# Patient Record
Sex: Female | Born: 1985
Health system: Southern US, Community
[De-identification: ages and names within clinical notes are randomized; demographics above are authoritative.]

---

## 2018-03-25 ENCOUNTER — Encounter (HOSPITAL_COMMUNITY): Payer: Self-pay | Admitting: Emergency Medicine

## 2018-03-25 ENCOUNTER — Other Ambulatory Visit: Payer: Self-pay

## 2018-03-25 ENCOUNTER — Emergency Department (HOSPITAL_COMMUNITY): Payer: No Typology Code available for payment source

## 2018-03-25 ENCOUNTER — Emergency Department (HOSPITAL_COMMUNITY)
Admission: EM | Admit: 2018-03-25 | Discharge: 2018-03-25 | Disposition: A | Discharge status: Home / Self Care | Payer: No Typology Code available for payment source | Attending: Emergency Medicine | Admitting: Emergency Medicine

## 2018-03-25 DIAGNOSIS — R1030 Lower abdominal pain, unspecified: Secondary | ICD-10-CM | POA: Diagnosis present

## 2018-03-25 DIAGNOSIS — R102 Pelvic and perineal pain: Secondary | ICD-10-CM

## 2018-03-25 DIAGNOSIS — N760 Acute vaginitis: Secondary | ICD-10-CM

## 2018-03-25 LAB — CBC WITH DIFFERENTIAL/PLATELET
Abs Immature Granulocytes: 0.02 10*3/uL (ref 0.00–0.07)
Basophils Absolute: 0.1 10*3/uL (ref 0.0–0.1)
Basophils Relative: 1 %
Eosinophils Absolute: 0.1 10*3/uL (ref 0.0–0.5)
Eosinophils Relative: 2 %
HCT: 44.2 % (ref 36.0–46.0)
Hemoglobin: 13.8 g/dL (ref 12.0–15.0)
Immature Granulocytes: 0 %
Lymphocytes Relative: 27 %
Lymphs Abs: 1.6 10*3/uL (ref 0.7–4.0)
MCH: 26.1 pg (ref 26.0–34.0)
MCHC: 31.2 g/dL (ref 30.0–36.0)
MCV: 83.6 fL (ref 80.0–100.0)
Monocytes Absolute: 0.6 10*3/uL (ref 0.1–1.0)
Monocytes Relative: 11 %
Neutro Abs: 3.6 10*3/uL (ref 1.7–7.7)
Neutrophils Relative %: 59 %
Platelets: 216 10*3/uL (ref 150–400)
RBC: 5.29 MIL/uL — AB (ref 3.87–5.11)
RDW: 13.2 % (ref 11.5–15.5)
WBC: 6 10*3/uL (ref 4.0–10.5)
nRBC: 0 % (ref 0.0–0.2)

## 2018-03-25 LAB — COMPREHENSIVE METABOLIC PANEL
ALT: 15 U/L (ref 0–44)
AST: 14 U/L — ABNORMAL LOW (ref 15–41)
Albumin: 4.3 g/dL (ref 3.5–5.0)
Alkaline Phosphatase: 49 U/L (ref 38–126)
Anion gap: 6 (ref 5–15)
BUN: 11 mg/dL (ref 6–20)
CO2: 25 mmol/L (ref 22–32)
Calcium: 9.4 mg/dL (ref 8.9–10.3)
Chloride: 106 mmol/L (ref 98–111)
Creatinine, Ser: 0.85 mg/dL (ref 0.44–1.00)
GFR calc non Af Amer: >60 mL/min (ref >60)
Glucose, Bld: 104 mg/dL — ABNORMAL HIGH (ref 70–99)
Potassium: 3.9 mmol/L (ref 3.5–5.1)
SODIUM: 137 mmol/L (ref 135–145)
Total Bilirubin: 0.7 mg/dL (ref 0.3–1.2)
Total Protein: 7.6 g/dL (ref 6.5–8.1)

## 2018-03-25 LAB — LIPASE, BLOOD: Lipase: 23 U/L (ref 11–51)

## 2018-03-25 LAB — URINALYSIS, ROUTINE W REFLEX MICROSCOPIC
Bilirubin Urine: NEGATIVE
Glucose, UA: NEGATIVE mg/dL
Hgb urine dipstick: NEGATIVE
KETONES UR: NEGATIVE mg/dL
Leukocytes, UA: NEGATIVE
Nitrite: NEGATIVE
Protein, ur: NEGATIVE mg/dL
Specific Gravity, Urine: 1.028 (ref 1.005–1.030)
pH: 6 (ref 5.0–8.0)

## 2018-03-25 LAB — WET PREP, GENITAL
Sperm: NONE SEEN
Trich, Wet Prep: NONE SEEN
YEAST WET PREP: NONE SEEN

## 2018-03-25 LAB — POC URINE PREG, ED: PREG TEST UR: NEGATIVE

## 2018-03-25 MED ORDER — METRONIDAZOLE 500 MG PO TABS: 500.0000 mg | ORAL_TABLET | Freq: Two times a day (BID) | ORAL | 0 refills | Status: DC

## 2018-03-25 NOTE — ED Triage Notes (Signed)
Pt c/ fo intermittent lower abdominal pain since December.   Denies any n/v/d or urinary symptoms.  States having sharp lower abdominal pain this morning while standing.

## 2018-03-25 NOTE — Discharge Instructions (Signed)
Thank you for allowing me to care for you today. Please return to the emergency department if you have new or worsening symptoms. Take your medications as instructed.  ° °

## 2018-03-25 NOTE — ED Provider Notes (Signed)
Medical screening examination/treatment/procedure(s) were conducted as a shared visit with non-physician practitioner(s) and myself.  I personally evaluated the patient during the encounter.  Clinical Impression:   Final diagnoses:  Pelvic pain  Pelvic pain in female  Acute vaginitis    The patient is a pleasant 33 year old female presenting with some lower abdominal pain that started a couple months ago, it is waxed and waned in intensity, she had an appointment with her doctor which she missed due to a child with the flu however she reports that this morning the pain became much more intense, 10 out of 10 in severity, came on acutely but has since improved and is now 2 out of 10.  She has no associated fevers chills nausea vomiting or diarrhea.  She has no urinary symptoms.  On my exam she has a very soft abdomen but focal tenderness in the suprapubic and left lower quadrant.  There is no pain at McBurney's point, no Murphy sign, nonsurgical abdomen.  Pelvic exam will be performed by the physician assistant, ultrasound ordered to rule out torsion, may be ovarian cysts or urinary tract infection.  Rule out pregnancy and STDs.   Eber HongMiller, Solita Macadam, MD 03/25/18 1535

## 2018-03-25 NOTE — ED Provider Notes (Signed)
[facility] EMERGENCY DEPARTMENT Provider Note   CSN: [id number] Arrival date & time: 03/25/18  [id number]     History   Chief Complaint Chief Complaint  Patient presents with  . Abdominal Pain    HPI Wanda Young is a 33 y.o. female.  Patient is a 33 y/o G3P2 female who presents to the ED for complaints of lower abdominal pain, intermittent, worsening since December. She states she made an appointment with PCP but missed the appointment and then today the pain became worse so she came to the ED. Reports the pain is sharp and comes on suddenly, lasting about 10 minutes at a time and resolves on its own. Today she was getting ready for work when she felt a very sharp pain in her LLQ that was 10/10. It resolved to a 2/10 on its own now. Denies fever, chills, n/v/d, change in appetite. LMP was 03/03/17 and was normal. She is sexually active. Not on birth control. Denies dysuria, vaginal discharge.      History reviewed. No pertinent past medical history.  There are no active problems to display for this patient.   History reviewed. No pertinent surgical history.   OB History   No obstetric history on file.      Home Medications    Prior to Admission medications   Medication Sig Start Date End Date Taking? Authorizing Provider  metroNIDAZOLE (FLAGYL) 500 MG tablet Take 1 tablet (500 mg total) by mouth 2 (two) times daily. 03/25/18   Arlyn DunningMcLean, Dorthea Maina A, PA-C    Family History No family history on file.  Social History Social History   Tobacco Use  . Smoking status: Never Smoker  . Smokeless tobacco: Never Used  Substance Use Topics  . Alcohol use: Yes  . Drug use: Never     Allergies   Patient has no allergy information on record.   Review of Systems Review of Systems  Constitutional: Negative.   HENT: Negative for congestion, sinus pressure, sinus pain, sore throat and tinnitus.   Respiratory: Negative for cough and shortness of breath.   Cardiovascular: Negative  for chest pain.  Gastrointestinal: Positive for abdominal pain. Negative for anal bleeding, constipation, diarrhea, nausea and vomiting.  Genitourinary: Positive for pelvic pain. Negative for decreased urine volume, difficulty urinating, dyspareunia, dysuria, flank pain, frequency, genital sores, hematuria, menstrual problem, urgency, vaginal bleeding, vaginal discharge and vaginal pain.  Musculoskeletal: Negative for back pain and myalgias.  Skin: Negative for rash.  Allergic/Immunologic: Negative for immunocompromised state.  Neurological: Negative for dizziness, light-headedness and headaches.  Hematological: Negative for adenopathy.     Physical Exam Updated Vital Signs Pulse 83   Temp 98.3 F (36.8 C) (Oral)   Resp 17   Ht 5\' 2"  (1.575 m)   Wt 69.4 kg   LMP 03/03/2018   SpO2 100%   BMI 27.98 kg/m   Physical Exam Vitals signs reviewed.  Constitutional:      General: She is not in acute distress.    Appearance: She is well-developed. She is not ill-appearing, toxic-appearing or diaphoretic.     Comments: Patient observed walking by herself to exam room, no distress  HENT:     Head: Normocephalic and atraumatic.     Mouth/Throat:     Pharynx: No pharyngeal swelling or oropharyngeal exudate.  Eyes:     Extraocular Movements: Extraocular movements intact.  Cardiovascular:     Rate and Rhythm: Normal rate.     Heart sounds: Normal heart sounds.  Pulmonary:  Effort: Pulmonary effort is normal.     Breath sounds: Normal breath sounds.  Abdominal:     General: Abdomen is flat.     Palpations: Abdomen is soft.     Tenderness: There is abdominal tenderness in the right lower quadrant, suprapubic area and left lower quadrant. There is no right CVA tenderness or left CVA tenderness.  Genitourinary:    Vagina: Normal.     Cervix: Normal.     Uterus: Tender.      Adnexa: Right adnexa normal.     Rectum: Normal.  Skin:    General: Skin is warm.  Neurological:      General: No focal deficit present.     Mental Status: She is alert.  Psychiatric:        Mood and Affect: Mood normal.      ED Treatments / Results  Labs (all labs ordered are listed, but only abnormal results are displayed) Labs Reviewed  WET PREP, GENITAL - Abnormal; Notable for the following components:      Result Value   Clue Cells Wet Prep HPF POC PRESENT (*)    WBC, Wet Prep HPF POC FEW (*)    All other components within normal limits  CBC WITH DIFFERENTIAL/PLATELET - Abnormal; Notable for the following components:   RBC 5.29 (*)    All other components within normal limits  COMPREHENSIVE METABOLIC PANEL - Abnormal; Notable for the following components:   Glucose, Bld 104 (*)    AST 14 (*)    All other components within normal limits  URINALYSIS, ROUTINE W REFLEX MICROSCOPIC  LIPASE, BLOOD  POC URINE PREG, ED  GC/CHLAMYDIA PROBE AMP ([place]) NOT AT [facility]    EKG None  Radiology [place] Pelvic Complete W Transvaginal And Torsion R/o  Result Date: 03/25/2018 CLINICAL DATA:  Pelvic pain for 3 months. EXAM: TRANSABDOMINAL AND TRANSVAGINAL ULTRASOUND OF PELVIS DOPPLER ULTRASOUND OF OVARIES TECHNIQUE: Both transabdominal and transvaginal ultrasound examinations of the pelvis were performed. Transabdominal technique was performed for global imaging of the pelvis including uterus, ovaries, adnexal regions, and pelvic cul-de-sac. It was necessary to proceed with endovaginal exam following the transabdominal exam to visualize the adnexa. Color and duplex Doppler ultrasound was utilized to evaluate blood flow to the ovaries. COMPARISON:  None. FINDINGS: Uterus Measurements: 9.8 x 6.2 x 7.0 cm = volume: 223.2 mL. Two fibroids are identified. One anteriorly on the right measures 1.9 cm and a second posteriorly on the left measures 2.4 cm. Endometrium Thickness: 0.7 cm.  No focal abnormality visualized. Right ovary Measurements: 2.7 x 1.9 x 2.0 cm = volume: 5.4 mL. Normal appearance/no  adnexal mass. Left ovary Measurements: 4.1 x 4.3 x 1.9 cm = volume: 18.5 mL. Normal appearance/no adnexal mass. Pulsed Doppler evaluation of both ovaries demonstrates normal low-resistance arterial and venous waveforms. Other findings No abnormal free fluid. IMPRESSION: Negative for ovarian torsion.  Negative exam. Electronically Signed   By: Drusilla Kannerhomas  Dalessio M.D.   On: 03/25/2018 11:24    Procedures Procedures (including critical care time)  Medications Ordered in ED Medications - No data to display   Initial Impression / Assessment and Plan / ED Course  I have reviewed the triage vital signs and the nursing notes.  Pertinent labs & imaging results that were available during my care of the patient were reviewed by me and considered in my medical decision making (see chart for details).  Clinical Course as of Mar 26 1143  Sat Mar 25, 2018  1142  Workup essentially negative. Patient does have some clue cells on wet prep and would like to be treated for vaginitis. I will treat with flagyl. Advised to f/u with PMD and return precautions discussed.    [KM]    Clinical Course User Index [KM] Arlyn Dunning, PA-C    Based on review of vitals, medical screening exam, lab work and/or imaging, there does not appear to be an acute, emergent etiology for the patient's symptoms. Counseled pt on good return precautions and encouraged both PCP and ED follow-up as needed.  Prior to discharge, I also discussed incidental imaging findings with patient in detail and advised appropriate, recommended follow-up in detail.  Clinical Impression: 1. Pelvic pain in female   2. Pelvic pain   3. Acute vaginitis     Disposition: Discharge    Final Clinical Impressions(s) / ED Diagnoses   Final diagnoses:  Pelvic pain  Pelvic pain in female  Acute vaginitis    ED Discharge Orders         Ordered    metroNIDAZOLE (FLAGYL) 500 MG tablet  2 times daily     03/25/18 1145           Jeral Pinch 03/25/18 1145    Eber Hong, MD 03/25/18 1535

## 2018-03-27 LAB — GC/CHLAMYDIA PROBE AMP (~~LOC~~) NOT AT ARMC
Chlamydia: NEGATIVE
Neisseria Gonorrhea: NEGATIVE

## 2019-03-25 ENCOUNTER — Encounter: Payer: Self-pay | Admitting: Emergency Medicine

## 2019-03-25 ENCOUNTER — Ambulatory Visit (INDEPENDENT_AMBULATORY_CARE_PROVIDER_SITE_OTHER): Payer: 59

## 2019-03-25 ENCOUNTER — Ambulatory Visit
Admission: EM | Admit: 2019-03-25 | Discharge: 2019-03-25 | Disposition: A | Discharge status: Home / Self Care | Payer: 59

## 2019-03-25 ENCOUNTER — Other Ambulatory Visit: Payer: Self-pay

## 2019-03-25 DIAGNOSIS — R0789 Other chest pain: Secondary | ICD-10-CM

## 2019-03-25 DIAGNOSIS — U071 COVID-19: Secondary | ICD-10-CM

## 2019-03-25 MED ORDER — BENZONATATE 100 MG PO CAPS: 100.0000 mg | ORAL_CAPSULE | Freq: Three times a day (TID) | ORAL | 0 refills | Status: DC

## 2019-03-25 MED ORDER — FLUTICASONE PROPIONATE 50 MCG/ACT NA SUSP: 1.0000 | Freq: Every day | NASAL | 0 refills | Status: DC

## 2019-03-25 NOTE — Discharge Instructions (Addendum)
You should remain isolated in your home for 10 days from symptom onset AND greater than 72 hours after symptoms resolution (absence of fever without the use of fever-reducing medication and improvement in respiratory symptoms), whichever is longer °Get plenty of rest and push fluids °Use medications daily for symptom relief °Use OTC medications like ibuprofen or tylenol as needed fever or pain °Call or go to the ED if you have any new or worsening symptoms such as fever, worsening cough, shortness of breath, chest tightness, chest pain, turning blue, changes in mental status, etc... °

## 2019-03-25 NOTE — ED Triage Notes (Signed)
Pt presents to UC w/ c/o increased pressure in chest since yesterday. Pt tested positive 3 days ago. Started having symptoms 1 week ago. Pt states she has a cough but is not productive. Pt is concerned for pneumonia.

## 2019-03-25 NOTE — ED Provider Notes (Signed)
[facility]    CSN: [id number] Arrival date & time: 03/25/19  1538      History   Chief Complaint Chief Complaint  Patient presents with  . covid +, chest tightness    HPI Wanda Young is a 34 y.o. female.   Who presented to the urgent care with a complaint of worsening Covid symptoms.  Reported cough, and chest tightness.  Report she tested positive for COVID-19 3 days ago.  Denies sick exposure to flu or strep.  Denies recent travel.  Denies aggravating or alleviating symptoms.  Denies previous COVID infection.   Denies fever, chills, fatigue, nasal congestion, rhinorrhea, sore throat, SOB, wheezing, chest pain, nausea, vomiting, changes in bowel or bladder habits.  She want a chest x-ray to be completed.  The history is provided by the patient. No language interpreter was used.    History reviewed. No pertinent past medical history.  There are no problems to display for this patient.   History reviewed. No pertinent surgical history.  OB History   No obstetric history on file.      Home Medications    Prior to Admission medications   Medication Sig Start Date End Date Taking? Authorizing Provider  cholecalciferol (VITAMIN D3) 25 MCG (1000 UNIT) tablet Take 1,000 Units by mouth daily.   Yes [provider]  Multiple Vitamin (MULTIVITAMIN) capsule Take 1 capsule by mouth daily.   Yes [provider]  benzonatate (TESSALON) 100 MG capsule Take 1 capsule (100 mg total) by mouth every 8 (eight) hours. 03/25/19   Kaysha Parsell, Darrelyn Hillock, FNP  fluticasone (FLONASE) 50 MCG/ACT nasal spray Place 1 spray into both nostrils daily for 14 days. 03/25/19 04/08/19  Dequavion Follette, Darrelyn Hillock, FNP  metroNIDAZOLE (FLAGYL) 500 MG tablet Take 1 tablet (500 mg total) by mouth 2 (two) times daily. 03/25/18   Alveria Apley, PA-C    Family History Family History  Problem Relation Age of Onset  . Healthy Mother   . Healthy Father     Social History Social History     Tobacco Use  . Smoking status: Never Smoker  . Smokeless tobacco: Never Used  Substance Use Topics  . Alcohol use: Yes  . Drug use: Never     Allergies   Patient has no known allergies.   Review of Systems Review of Systems  Constitutional: Negative.   HENT: Negative.   Respiratory: Positive for cough and chest tightness.   Cardiovascular: Negative.   Gastrointestinal: Negative.   Neurological: Negative.      Physical Exam Triage Vital Signs ED Triage Vitals  Enc Vitals Group     BP      Pulse      Resp      Temp      Temp src      SpO2      Weight      Height      Head Circumference      Peak Flow      Pain Score      Pain Loc      Pain Edu?      Excl. in [place]?    No data found.  Updated Vital Signs BP 129/90 (BP Location: Right Arm)   Pulse 82   Temp 98.2 F (36.8 C) (Oral)   Resp 16   SpO2 98%   Visual Acuity Right Eye Distance:   Left Eye Distance:   Bilateral Distance:    Right Eye Near:  Left Eye Near:    Bilateral Near:     Physical Exam Vitals and nursing note reviewed.  Constitutional:      General: She is not in acute distress.    Appearance: Normal appearance. She is normal weight. She is not ill-appearing or toxic-appearing.  HENT:     Head: Normocephalic.     Right Ear: Tympanic membrane, ear canal and external ear normal. There is no impacted cerumen.     Left Ear: Tympanic membrane, ear canal and external ear normal. There is no impacted cerumen.     Nose: Nose normal. No congestion.     Mouth/Throat:     Mouth: Mucous membranes are moist.     Pharynx: Oropharynx is clear. No oropharyngeal exudate or posterior oropharyngeal erythema.  Cardiovascular:     Rate and Rhythm: Normal rate and regular rhythm.     Pulses: Normal pulses.     Heart sounds: Normal heart sounds. No murmur.  Pulmonary:     Effort: Pulmonary effort is normal. No respiratory distress.     Breath sounds: Normal breath sounds. No wheezing or rhonchi.   Chest:     Chest wall: No tenderness.  Abdominal:     General: Abdomen is flat. Bowel sounds are normal. There is no distension.     Palpations: There is no mass.     Tenderness: There is no abdominal tenderness.  Skin:    Capillary Refill: Capillary refill takes less than 2 seconds.  Neurological:     General: No focal deficit present.     Mental Status: She is alert and oriented to person, place, and time.      UC Treatments / Results  Labs (all labs ordered are listed, but only abnormal results are displayed) Labs Reviewed - No data to display  EKG   Radiology No results found.  Procedures Procedures (including critical care time)  Medications Ordered in UC Medications - No data to display  Initial Impression / Assessment and Plan / UC Course  I have reviewed the triage vital signs and the nursing notes.  Pertinent labs & imaging results that were available during my care of the patient were reviewed by me and considered in my medical decision making (see chart for details).    Chest x-ray was ordered and result was reviewed.  Result was negative for  Cardiopulmonary disease Tessalon was ordered Flonase was ordered Advised patient to remain quarantine To go to ED for worsening of symptoms Patient verbalized understanding of the plan of care  Final Clinical Impressions(s) / UC Diagnoses   Final diagnoses:  COVID-19 virus infection     Discharge Instructions     You should remain isolated in your home for 10 days from symptom onset AND greater than 72 hours after symptoms resolution (absence of fever without the use of fever-reducing medication and improvement in respiratory symptoms), whichever is longer Get plenty of rest and push fluids Use medications daily for symptom relief Use OTC medications like ibuprofen or tylenol as needed fever or pain Call or go to the ED if you have any new or worsening symptoms such as fever, worsening cough, shortness of  breath, chest tightness, chest pain, turning blue, changes in mental status, etc...     ED Prescriptions    Medication Sig Dispense Auth. Provider   benzonatate (TESSALON) 100 MG capsule Take 1 capsule (100 mg total) by mouth every 8 (eight) hours. 21 capsule Timiko Offutt, Zachery Dakins, FNP   fluticasone (FLONASE) 50  MCG/ACT nasal spray Place 1 spray into both nostrils daily for 14 days. 16 g Durward Parcel, FNP     PDMP not reviewed this encounter.   Durward Parcel, FNP 03/25/19 1609

## 2019-05-02 ENCOUNTER — Ambulatory Visit (INDEPENDENT_AMBULATORY_CARE_PROVIDER_SITE_OTHER): Payer: 59 | Admitting: Advanced Practice Midwife

## 2019-05-02 ENCOUNTER — Encounter: Payer: Self-pay | Admitting: Advanced Practice Midwife

## 2019-05-02 ENCOUNTER — Other Ambulatory Visit (HOSPITAL_COMMUNITY)
Admission: RE | Admit: 2019-05-02 | Discharge: 2019-05-02 | Disposition: A | Discharge status: Home / Self Care | Payer: 59 | Source: Ambulatory Visit | Attending: Advanced Practice Midwife | Admitting: Advanced Practice Midwife

## 2019-05-02 ENCOUNTER — Other Ambulatory Visit: Payer: Self-pay

## 2019-05-02 VITALS — BP 130/89 | HR 72 | Ht 62.0 in | Wt 153.5 lb

## 2019-05-02 DIAGNOSIS — Z3202 Encounter for pregnancy test, result negative: Secondary | ICD-10-CM | POA: Diagnosis not present

## 2019-05-02 DIAGNOSIS — Z01419 Encounter for gynecological examination (general) (routine) without abnormal findings: Secondary | ICD-10-CM

## 2019-05-02 LAB — POCT URINE PREGNANCY: Preg Test, Ur: NEGATIVE

## 2019-05-02 MED ORDER — ETONOGESTREL-ETHINYL ESTRADIOL 0.12-0.015 MG/24HR VA RING: VAGINAL_RING | VAGINAL | 3 refills | Status: DC

## 2019-05-02 NOTE — Progress Notes (Signed)
WELL-WOMAN EXAMINATION Patient name: Wanda Young MRN [id number]  Date of birth: [birth year] Chief Complaint:   Gynecologic Exam (interested in birth control pills)  History of Present Illness:   Wanda Young is a 34 y.o. G82P2012 African American female being seen today for a routine well-woman exam.  Current complaints: Not currently on contraception- would like to start> options rev'd and decision for Nuvaring; had hx abn pap/colpo years ago followed by neg Paps since.  PCP: needs to establish- recommended Robbie Lis      Did not discuss labs Patient's last menstrual period was 04/22/2019. The current method of family planning is none.  Last pap not sure. Results were: normal. H/O abnormal pap: Yes Last mammogram: none yet. Family h/o breast cancer: Yes- PGM in her 74s Last colonoscopy: none yet. Family h/o colorectal cancer: No Review of Systems:   Pertinent items are noted in HPI Denies any headaches, blurred vision, fatigue, shortness of breath, chest pain, abdominal pain, abnormal vaginal discharge/itching/odor/irritation, problems with periods, bowel movements, urination, or intercourse unless otherwise stated above. Pertinent History Reviewed:  Reviewed past medical,surgical, social and family history.  Reviewed problem list, medications and allergies. Physical Assessment:   Vitals:   05/02/19 1445  BP: 130/89  Pulse: 72  Weight: 153 lb 8 oz (69.6 kg)  Height: 5\' 2"  (1.575 m)  Body mass index is 28.08 kg/m.        Physical Examination:   General appearance - well appearing, and in no distress  Mental status - alert, oriented to person, place, and time  Psych:  She has a normal mood and affect  Skin - warm and dry, normal color, no suspicious lesions noted  Chest - effort normal, all lung fields clear to auscultation bilaterally  Heart - normal rate and regular rhythm  Neck:  midline trachea, no thyromegaly or nodules  Breasts - breasts appear normal, no suspicious  masses, no skin or nipple changes or  axillary nodes  Abdomen - soft, nontender, nondistended, no masses or organomegaly  Pelvic - VULVA: normal appearing vulva with no masses, tenderness or lesions  VAGINA: normal appearing vagina with normal color and discharge, no lesions  CERVIX: normal appearing cervix without discharge or lesions, no CMT  Thin prep pap is done with HR HPV cotesting  UTERUS: uterus is felt to be normal size, shape, consistency and nontender   ADNEXA: No adnexal masses or tenderness noted.  Extremities:  No swelling or varicosities noted  Chaperone: Amanda Rash    Results for orders placed or performed in visit on 05/02/19 (from the past 24 hour(s))  POCT urine pregnancy   Collection Time: 05/02/19  2:54 PM  Result Value Ref Range   Preg Test, Ur Negative Negative    Assessment & Plan:  1) Well-Woman Exam  2) New start on Nuvaring> begin with next menses, use backup for the first month   Labs/procedures today: Pap & cultures  Mammogram age 53 or sooner if problems Colonoscopy age 55 or sooner if problems  Orders Placed This Encounter  Procedures  . POCT urine pregnancy    Meds:  Meds ordered this encounter  Medications  . etonogestrel-ethinyl estradiol (NUVARING) 0.12-0.015 MG/24HR vaginal ring    Sig: Insert vaginally and leave in place for 3 consecutive weeks, then remove for 1 week.    Dispense:  1 each    Refill:  3    Order Specific Question:   Supervising Provider    Answer:   03-20-1971 [phone]  Follow-up: Return for 3 mos contraception f/u.  Myrtis Ser CNM 05/02/2019 3:24 PM

## 2019-05-02 NOTE — Patient Instructions (Signed)
Ethinyl Estradiol; Etonogestrel vaginal ring What is this medicine? ETHINYL ESTRADIOL; ETONOGESTREL (ETH in il es tra DYE ole; et oh noe JES trel) vaginal ring is a flexible, vaginal ring used as a contraceptive (birth control method). This medicine combines 2 types of female hormones, an estrogen and a progestin. This ring is used to prevent ovulation and pregnancy. Each ring is effective for 1 month. This medicine may be used for other purposes; ask your health care provider or pharmacist if you have questions. COMMON BRAND NAME(S): EluRyng, NuvaRing What should I tell my health care provider before I take this medicine? They need to know if you have any of these conditions:  abnormal vaginal bleeding  blood vessel disease or blood clots  breast, cervical, endometrial, ovarian, liver, or uterine cancer  diabetes  gallbladder disease  having surgery  heart disease or recent heart attack  high blood pressure  high cholesterol or triglycerides  history of irregular heartbeat or heart valve problems  kidney disease  liver disease  migraine headaches  protein C deficiency  protein S deficiency  recently had a baby, miscarriage, or abortion  stroke  systemic lupus erythematosus (SLE)  tobacco smoker  your age is more than 32 years old  an unusual or allergic reaction to estrogens, progestins, other medicines, foods, dyes, or preservatives  pregnant or trying to get pregnant  breast-feeding How should I use this medicine? Insert the ring into your vagina as directed. Follow the directions on the prescription label. The ring will remain place for 3 weeks and is then removed for a 1-week break. A new ring is inserted 1 week after the last ring was removed, on the same day of the week. Check often to make sure the ring is still in place. If the ring was out of the vagina for an unknown amount of time, you may not be protected from pregnancy. Perform a pregnancy test and  call your doctor. Do not use more often than directed. A patient package insert for the product will be given with each prescription and refill. Read this sheet carefully each time. The sheet may change frequently. Contact your pediatrician regarding the use of this medicine in children. Special care may be needed. Overdosage: If you think you have taken too much of this medicine contact a poison control center or emergency room at once. NOTE: This medicine is only for you. Do not share this medicine with others. What if I miss a dose? You will need to use the ring exactly as directed. It is very important to follow the schedule every cycle. If you do not use the ring as directed, you may not be protected from pregnancy. If the ring should slip out, is lost, or if you leave it in longer or shorter than you should, contact your health care professional for advice. What may interact with this medicine? Do not take this medicine with the following medications:  dasabuvir; ombitasvir; paritaprevir; ritonavir  ombitasvir; paritaprevir; ritonavir  vaginal lubricants or other vaginal products that are oil-based or silicone-based This medicine may also interact with the following medications:  acetaminophen  antibiotics or medicines for infections, especially rifampin, rifabutin, rifapentine, and griseofulvin, and possibly penicillins or tetracyclines  aprepitant or fosaprepitant  armodafinil  ascorbic acid (vitamin C)  barbiturate medicines, such as phenobarbital or primidone  bosentan  certain antiviral medicines for hepatitis, HIV or AIDS  certain medicines for cancer treatment  certain medicines for seizures like carbamazepine, clobazam, felbamate, lamotrigine, oxcarbazepine, phenytoin,   rufinamide, topiramate  certain medicines for treating high cholesterol  cyclosporine  dantrolene  elagolix  flibanserin  grapefruit juice  lesinurad  medicines for diabetes  medicines  to treat fungal infections, such as griseofulvin, miconazole, fluconazole, ketoconazole, itraconazole, posaconazole or voriconazole  mifepristone  mitotane  modafinil  morphine  mycophenolate  St. John's wort  tamoxifen  temazepam  theophylline or aminophylline  thyroid hormones  tizanidine  tranexamic acid  ulipristal  warfarin This list may not describe all possible interactions. Give your health care provider a list of all the medicines, herbs, non-prescription drugs, or dietary supplements you use. Also tell them if you smoke, drink alcohol, or use illegal drugs. Some items may interact with your medicine. What should I watch for while using this medicine? Visit your doctor or health care professional for regular checks on your progress. You will need a regular breast and pelvic exam and Pap smear while on this medicine. Check with your doctor or health care professional to see if you need an additional method of contraception during the first cycle that you use this ring. Female condoms (made with natural rubber latex, polyisoprene, and polyurethane) and spermicides may be used. Do not use a diaphragm, cervical cap, or a female condom, as the ring can interfere with these birth control methods and their proper placement. If you have any reason to think you are pregnant, stop using this medicine right away and contact your doctor or health care professional. If you are using this medicine for hormone related problems, it may take several cycles of use to see improvement in your condition. Smoking increases the risk of getting a blood clot or having a stroke while you are using hormonal birth control, especially if you are more than 32 years old. You are strongly advised not to smoke. Some women are prone to getting dark patches on the skin of the face (cholasma). Your risk of getting chloasma with this medicine is higher if you had chloasma during a pregnancy. Keep out of the  sun. If you cannot avoid being in the sun, wear protective clothing and use sunscreen. Do not use sun lamps or tanning beds/booths. This medicine can make your body retain fluid, making your fingers, hands, or ankles swell. Your blood pressure can go up. Contact your doctor or health care professional if you feel you are retaining fluid. If you are going to have elective surgery, you may need to stop using this medicine before the surgery. Consult your health care professional for advice. This medicine does not protect you against HIV infection (AIDS) or any other sexually transmitted diseases. What side effects may I notice from receiving this medicine? Side effects that you should report to your doctor or health care professional as soon as possible:  allergic reactions such as skin rash or itching, hives, swelling of the lips, mouth, tongue, or throat  depression  high blood pressure  migraines or severe, sudden headaches  signs and symptoms of a blood clot such as breathing problems; changes in vision; chest pain; severe, sudden headache; pain, swelling, warmth in the leg; trouble speaking; sudden numbness or weakness of the face, arm or leg  signs and symptoms of infection like fever or chills with dizziness and a sunburn-like rash, or pain or trouble passing urine  stomach pain  symptoms of vaginal infection like itching, irritation or unusual discharge  yellowing of the eyes or skin Side effects that usually do not require medical attention (report these to your doctor   or health care professional if they continue or are bothersome):  acne  breast pain, tenderness  irregular vaginal bleeding or spotting, particularly during the first month of use  mild headache  nausea  painful periods  vomiting This list may not describe all possible side effects. Call your doctor for medical advice about side effects. You may report side effects to FDA at 1-800-FDA-1088. Where should I  keep my medicine? Keep out of the reach of children. Store unopened medicine for up to 4 months at room temperature at 15 and 30 degrees C (59 and 86 degrees F). Protect from light. Do not store above 30 degrees C (86 degrees F). Throw away any unused medicine 4 months after the dispense date or the expiration date, whichever comes first. A ring may only be used for 1 cycle (1 month). After the 3-week cycle, a used ring is removed and should be placed in the re-closable foil pouch and discarded in the trash out of reach of children and pets. Do NOT flush down the toilet. NOTE: This sheet is a summary. It may not cover all possible information. If you have questions about this medicine, talk to your doctor, pharmacist, or health care provider.  2020 Elsevier/Gold Standard (2018-08-31 12:31:47)  

## 2019-05-07 LAB — CYTOLOGY - PAP
Chlamydia: NEGATIVE
Comment: NEGATIVE
Comment: NEGATIVE
Comment: NORMAL
Diagnosis: NEGATIVE
High risk HPV: NEGATIVE
Neisseria Gonorrhea: NEGATIVE

## 2019-07-12 DIAGNOSIS — R1084 Generalized abdominal pain: Secondary | ICD-10-CM | POA: Diagnosis not present

## 2019-08-01 ENCOUNTER — Ambulatory Visit (INDEPENDENT_AMBULATORY_CARE_PROVIDER_SITE_OTHER): Payer: 59 | Admitting: Advanced Practice Midwife

## 2019-08-01 ENCOUNTER — Encounter: Payer: Self-pay | Admitting: Advanced Practice Midwife

## 2019-08-01 VITALS — BP 108/75 | HR 78 | Ht 62.0 in | Wt 153.4 lb

## 2019-08-01 DIAGNOSIS — Z3040 Encounter for surveillance of contraceptives, unspecified: Secondary | ICD-10-CM | POA: Diagnosis not present

## 2019-08-01 DIAGNOSIS — R519 Headache, unspecified: Secondary | ICD-10-CM

## 2019-08-01 NOTE — Progress Notes (Signed)
   GYN VISIT Patient name: Wanda Young MRN [id number]  Date of birth: 08-29-1985 Chief Complaint:   stop using nuvaring (headches)  History of Present Illness:   Wanda Young is a 34 y.o. G3P2012 African American female being seen today for f/u of new Nuvaring start 2 months ago. In her 2nd month of using it she developed more frequent headaches- isn't sure it was from the Nuvaring- but decided not to start her 3rd cycle. Decision for no contraception now; SO is in agreement with this. She is not actively trying to conceive, but they both would be on board with a pregnancy.     Depression screen PHQ 2/9 05/02/2019  Decreased Interest 2  Down, Depressed, Hopeless 2  PHQ - 2 Score 4  Altered sleeping 2  Tired, decreased energy 2  Change in appetite 2  Feeling bad or failure about yourself  1  Trouble concentrating 2  Moving slowly or fidgety/restless 0  Suicidal thoughts 0  PHQ-9 Score 13    Patient's last menstrual period was 07/28/2019. The current method of family planning is stopping Nuvaring, now none.  Last pap March 2021. Results were:  normal Review of Systems:   Pertinent items are noted in HPI Denies fever/chills, dizziness, headaches, visual disturbances, fatigue, shortness of breath, chest pain, abdominal pain, vomiting, abnormal vaginal discharge/itching/odor/irritation, problems with periods, bowel movements, urination, or intercourse unless otherwise stated above.  Pertinent History Reviewed:  Reviewed past medical,surgical, social, obstetrical and family history.  Reviewed problem list, medications and allergies. Physical Assessment:   Vitals:   08/01/19 1451  BP: 108/75  Pulse: 78  Weight: 153 lb 6.4 oz (69.6 kg)  Height: 5\' 2"  (1.575 m)  Body mass index is 28.06 kg/m.       Physical Examination:   General appearance: alert, well appearing, and in no distress  Mental status: alert, oriented to person, place, and time  Skin: warm & dry    Cardiovascular: normal heart rate noted  Respiratory: normal respiratory effort, no distress  Abdomen: soft, non-tender   Pelvic: examination not indicated  Extremities: no edema   Chaperone: n/a    No results found for this or any previous visit (from the past 24 hour(s)).  Assessment & Plan:  1) Headaches with new Nuvaring start> decision for no contraception at this time; recommended take daily multivit or PNV   Meds: No orders of the defined types were placed in this encounter.   No orders of the defined types were placed in this encounter.   Return for prn.  CNM 08/01/2019 5:11 PM

## 2019-09-03 DIAGNOSIS — Z020 Encounter for examination for admission to educational institution: Secondary | ICD-10-CM | POA: Diagnosis not present

## 2020-01-14 DIAGNOSIS — E559 Vitamin D deficiency, unspecified: Secondary | ICD-10-CM | POA: Diagnosis not present

## 2020-01-14 DIAGNOSIS — Z113 Encounter for screening for infections with a predominantly sexual mode of transmission: Secondary | ICD-10-CM | POA: Diagnosis not present

## 2020-01-14 DIAGNOSIS — F411 Generalized anxiety disorder: Secondary | ICD-10-CM | POA: Diagnosis not present

## 2020-04-19 DIAGNOSIS — R0789 Other chest pain: Secondary | ICD-10-CM | POA: Diagnosis not present

## 2020-06-11 DIAGNOSIS — S39012A Strain of muscle, fascia and tendon of lower back, initial encounter: Secondary | ICD-10-CM | POA: Diagnosis not present

## 2020-10-22 ENCOUNTER — Other Ambulatory Visit (INDEPENDENT_AMBULATORY_CARE_PROVIDER_SITE_OTHER): Payer: 59

## 2020-10-22 ENCOUNTER — Other Ambulatory Visit (HOSPITAL_COMMUNITY)
Admission: RE | Admit: 2020-10-22 | Discharge: 2020-10-22 | Disposition: A | Discharge status: Home / Self Care | Payer: 59 | Source: Ambulatory Visit | Attending: Obstetrics & Gynecology | Admitting: Obstetrics & Gynecology

## 2020-10-22 ENCOUNTER — Other Ambulatory Visit: Payer: Self-pay

## 2020-10-22 DIAGNOSIS — Z113 Encounter for screening for infections with a predominantly sexual mode of transmission: Secondary | ICD-10-CM | POA: Insufficient documentation

## 2020-10-22 DIAGNOSIS — N898 Other specified noninflammatory disorders of vagina: Secondary | ICD-10-CM

## 2020-10-22 NOTE — Progress Notes (Signed)
Chart reviewed for nurse visit. Agree with plan of care.  Adline Potter, NP 10/22/2020 1:19 PM

## 2020-10-22 NOTE — Progress Notes (Signed)
   NURSE VISIT- VAGINITIS/STD  SUBJECTIVE:  Wanda Young is a 35 y.o. [id number] GYN patientfemale here for a vaginal swab for vaginitis screening, STD screen.  She reports the following symptoms: discharge described as white and curd-like, local irritation, and vulvar itching for 1.5 weeks. Denies abnormal vaginal bleeding, significant pelvic pain, fever, or UTI symptoms.  OBJECTIVE:  There were no vitals taken for this visit.  Appears well, in no apparent distress  ASSESSMENT: Vaginal swab for  vaginitis & STD screening  PLAN: Self-collected vaginal probe for Gonorrhea, Chlamydia, Trichomonas, Bacterial Vaginosis, Yeast sent to lab Treatment: to be determined once results are received Follow-up as needed if symptoms persist/worsen, or new symptoms develop  Linn Goetze A Kenwood Rosiak  10/22/2020 10:56 AM

## 2020-10-23 ENCOUNTER — Other Ambulatory Visit: Payer: Self-pay | Admitting: Adult Health

## 2020-10-23 LAB — CERVICOVAGINAL ANCILLARY ONLY
Bacterial Vaginitis (gardnerella): POSITIVE — AB
Candida Glabrata: NEGATIVE
Candida Vaginitis: NEGATIVE
Chlamydia: NEGATIVE
Comment: NEGATIVE
Comment: NEGATIVE
Comment: NEGATIVE
Comment: NEGATIVE
Comment: NEGATIVE
Comment: NORMAL
Neisseria Gonorrhea: NEGATIVE
Trichomonas: NEGATIVE

## 2020-10-23 MED ORDER — METRONIDAZOLE 500 MG PO TABS: 500.0000 mg | ORAL_TABLET | Freq: Two times a day (BID) | ORAL | 0 refills | Status: DC

## 2020-10-23 NOTE — Progress Notes (Signed)
+  BV on vaginal swab will rx flagyl 

## 2020-11-19 ENCOUNTER — Encounter: Payer: Self-pay | Admitting: Advanced Practice Midwife

## 2020-11-19 ENCOUNTER — Ambulatory Visit (INDEPENDENT_AMBULATORY_CARE_PROVIDER_SITE_OTHER): Payer: 59 | Admitting: Advanced Practice Midwife

## 2020-11-19 ENCOUNTER — Other Ambulatory Visit: Payer: Self-pay

## 2020-11-19 VITALS — BP 123/81 | HR 67 | Ht 62.0 in | Wt 153.0 lb

## 2020-11-19 DIAGNOSIS — Z01419 Encounter for gynecological examination (general) (routine) without abnormal findings: Secondary | ICD-10-CM

## 2020-11-19 NOTE — Progress Notes (Signed)
WELL-WOMAN EXAMINATION Patient name: Wanda Young MRN [id number]  Date of birth: 1985-07-19 Chief Complaint:   Gynecologic Exam (Pap/physcial / last pap 05-02-19 normal)  History of Present Illness:   Wanda Young is a 35 y.o. G82P2012 African-American female being seen today for a routine well-woman exam.  Current complaints: doing well; isn't using NuvaRing anymore- she and partner would be okay with a pregnancy; occ feels pelvic pressure that spont resolves; no dysuria; is constipated when not taking fiber  PCP: [facility] in [place]      does not desire labs Patient's last menstrual period was 10/26/2020. The current method of family planning is none.  Last pap March 2021. Results were: NILM w/ HRHPV negative. H/O abnormal pap: no Last mammogram: never. Results were: N/A. Family h/o breast cancer: yes , GM was in 76's Last colonoscopy: never. Results were: N/A. Family h/o colorectal cancer: no  Depression screen [facility] 2/9 11/19/2020 05/02/2019  Decreased Interest 1 2  Down, Depressed, Hopeless 1 2  PHQ - 2 Score 2 4  Altered sleeping 1 2  Tired, decreased energy 1 2  Change in appetite 1 2  Feeling bad or failure about yourself  1 1  Trouble concentrating 1 2  Moving slowly or fidgety/restless 0 0  Suicidal thoughts 0 0  PHQ-9 Score 7 13     GAD 7 : Generalized Anxiety Score 11/19/2020  Nervous, Anxious, on Edge 1  Control/stop worrying 1  Worry too much - different things 1  Trouble relaxing 1  Restless 1  Easily annoyed or irritable 1  Afraid - awful might happen 0  Total GAD 7 Score 6     Review of Systems:   Pertinent items are noted in HPI Denies any headaches, blurred vision, fatigue, shortness of breath, chest pain, abdominal pain, abnormal vaginal discharge/itching/odor/irritation, problems with periods, bowel movements, urination, or intercourse unless otherwise stated above. Pertinent History Reviewed:  Reviewed past medical,surgical, social and family history.   Reviewed problem list, medications and allergies. Physical Assessment:   Vitals:   11/19/20 1026  BP: 123/81  Pulse: 67  Weight: 153 lb (69.4 kg)  Height: 5\' 2"  (1.575 m)  Body mass index is 27.98 kg/m.        Physical Examination:   General appearance - well appearing, and in no distress  Mental status - alert, oriented to person, place, and time  Psych:  She has a normal mood and affect  Skin - warm and dry, normal color, no suspicious lesions noted  Chest - effort normal, all lung fields clear to auscultation bilaterally  Heart - normal rate and regular rhythm  Neck:  midline trachea, no thyromegaly or nodules  Breasts - breasts appear normal, no suspicious masses, no skin or nipple changes or  axillary nodes  Abdomen - soft, nontender, nondistended, no masses or organomegaly  Pelvic - VULVA: normal appearing vulva with no masses, tenderness or lesions  VAGINA: normal appearing vagina with normal color and discharge, no lesions  CERVIX: normal appearing cervix without discharge or lesions, no CMT  Thin prep pap is not done   UTERUS: uterus is felt to be normal size, shape, consistency and nontender   ADNEXA: No adnexal masses or tenderness noted.  Extremities:  No swelling or varicosities noted  Chaperone: Peggy Dones    No results found for this or any previous visit (from the past 24 hour(s)).  Assessment & Plan:  1) Well-Woman Exam   Labs/procedures today: none  Mammogram: @ 33yo,  or sooner if problems Colonoscopy: @ 33yo, or sooner if problems  No orders of the defined types were placed in this encounter.   Meds: No orders of the defined types were placed in this encounter.   Follow-up: Return in about 1 year (around 11/19/2021) for Physical.  Arabella Merles CNM 11/19/2020 1:11 PM

## 2020-12-31 IMAGING — US US PELVIS COMPLETE TRANSABD/TRANSVAG W DUPLEX
1 series · 13 of 25 positions shown · non-contrast
Comparison: None.

CLINICAL DATA: Pelvic pain for 3 months.

EXAM:
TRANSABDOMINAL AND TRANSVAGINAL ULTRASOUND OF PELVIS
DOPPLER ULTRASOUND OF OVARIES
TECHNIQUE: Both transabdominal and transvaginal ultrasound examinations of the
pelvis were performed. Transabdominal technique was performed for
global imaging of the pelvis including uterus, ovaries, adnexal
regions, and pelvic cul-de-sac.
It was necessary to proceed with endovaginal exam following the
transabdominal exam to visualize the adnexa. Color and duplex
Doppler ultrasound was utilized to evaluate blood flow to the
ovaries.

[Series 1: us pelvis complete transabd/transvag w duplex · 13 of 89 slices shown]
[im 1/89]
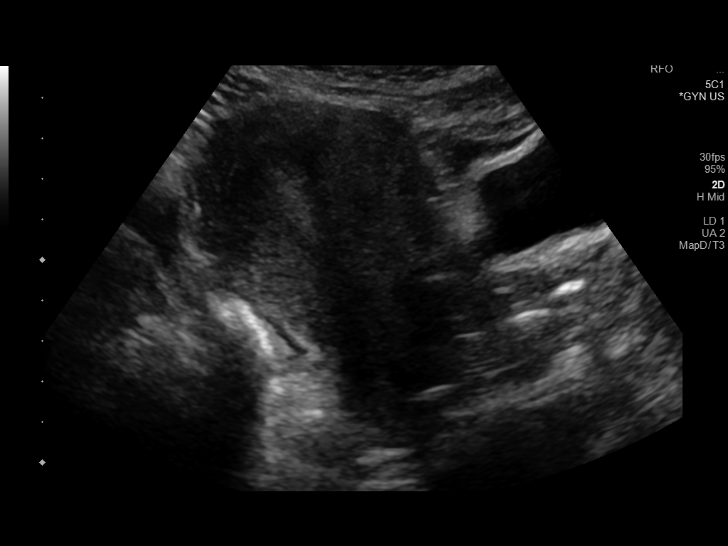
[im 8/89]
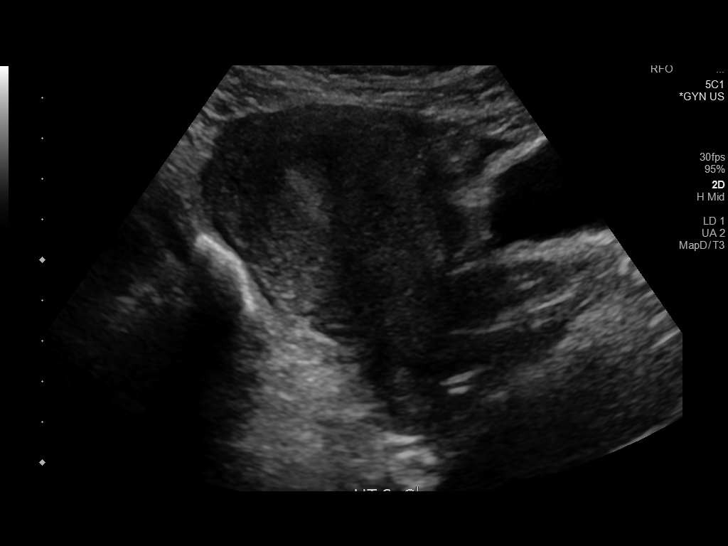
[im 15/89]
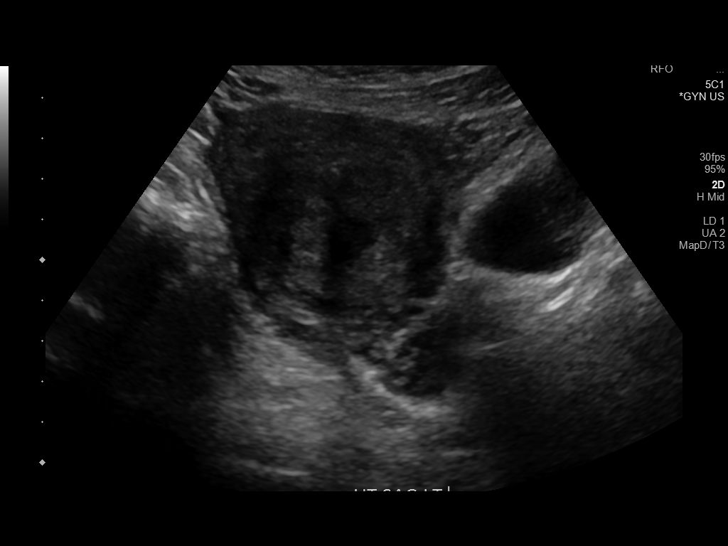
[im 23/89]
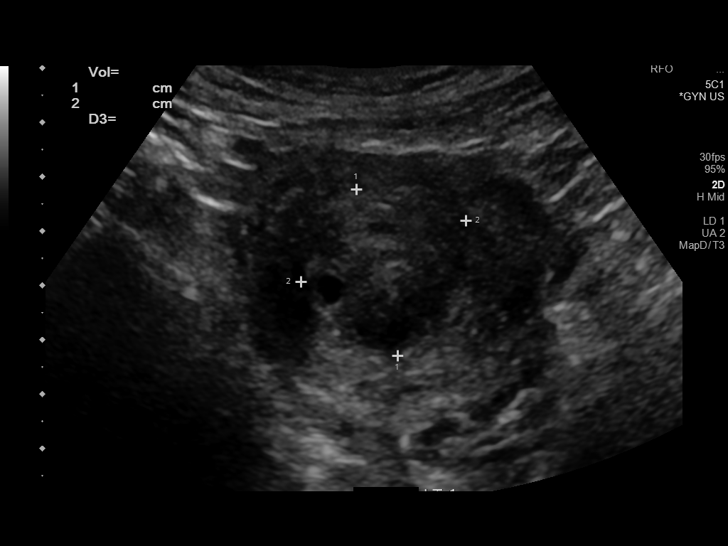
[im 30/89]
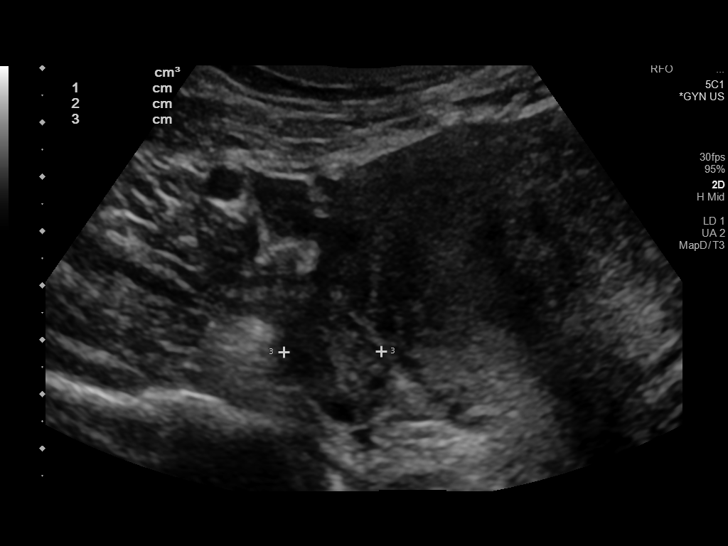
[im 37/89]
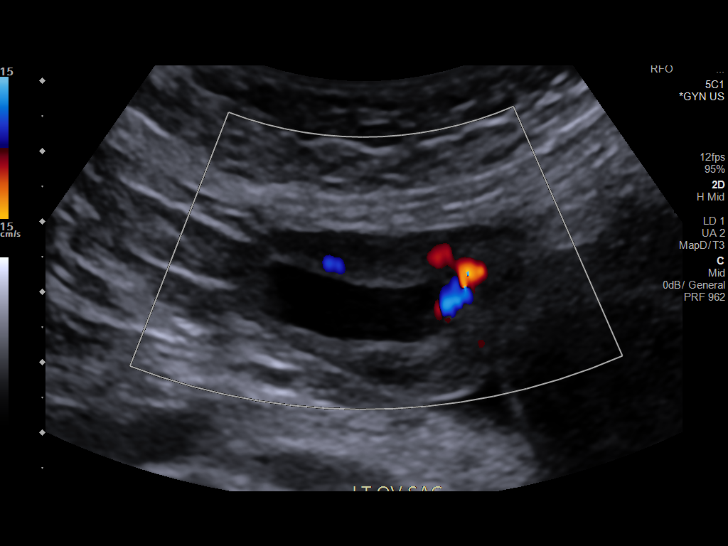
[im 45/89]
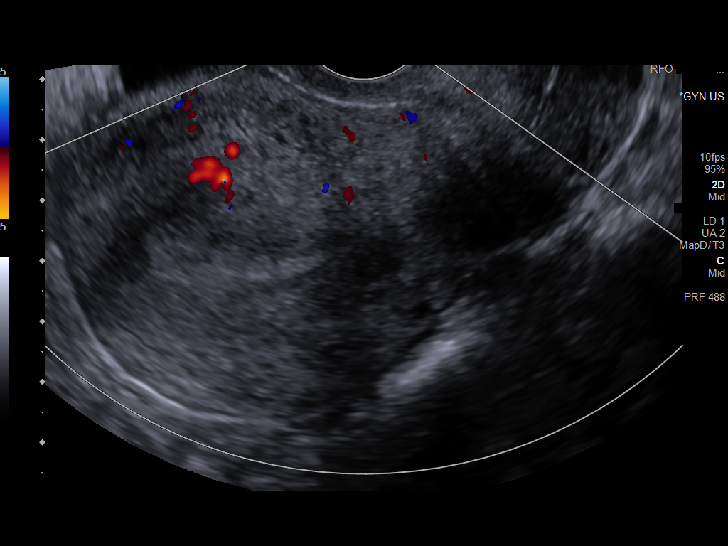
[im 52/89]
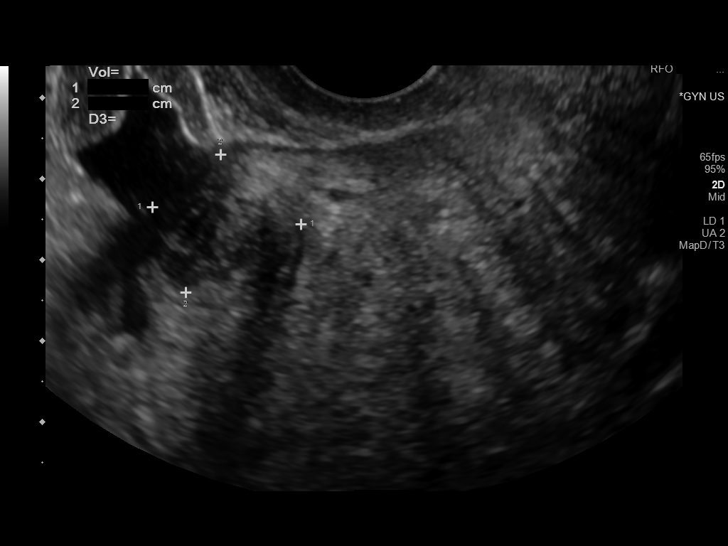
[im 59/89]
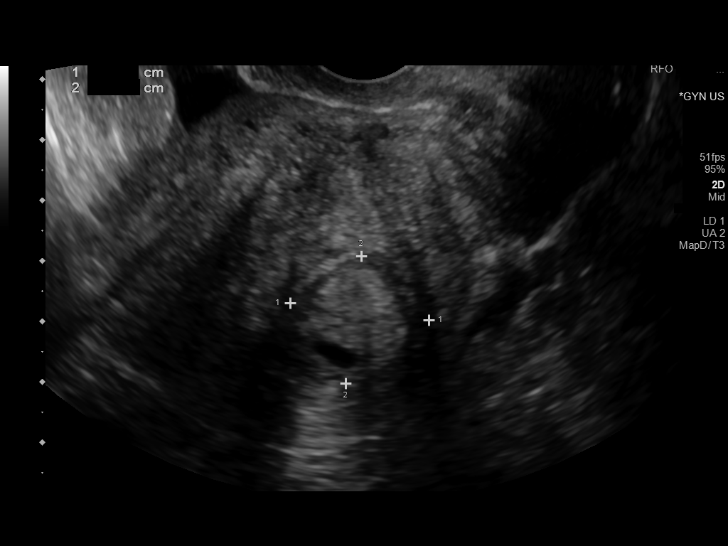
[im 67/89]
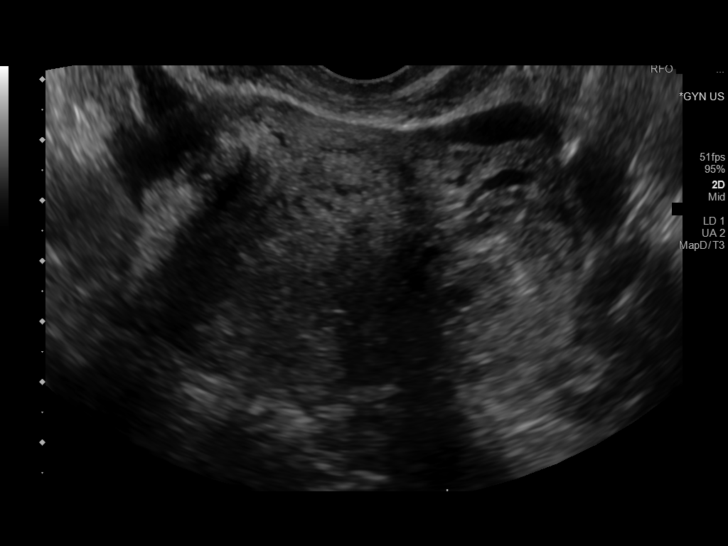
[im 74/89]
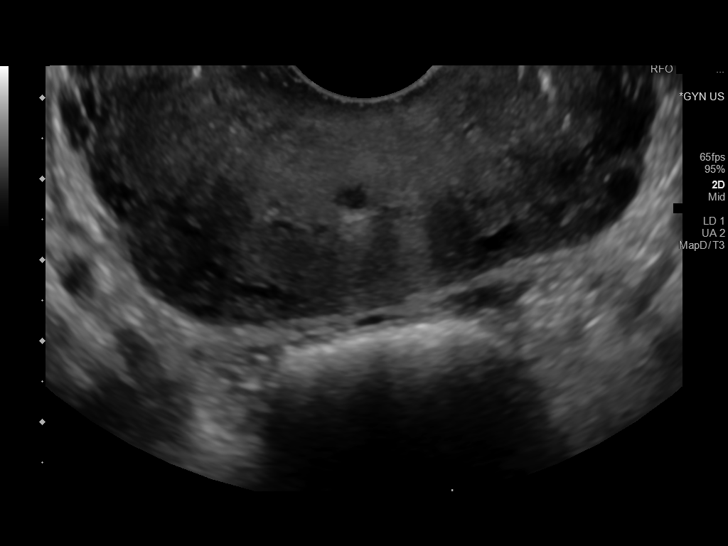
[im 81/89]
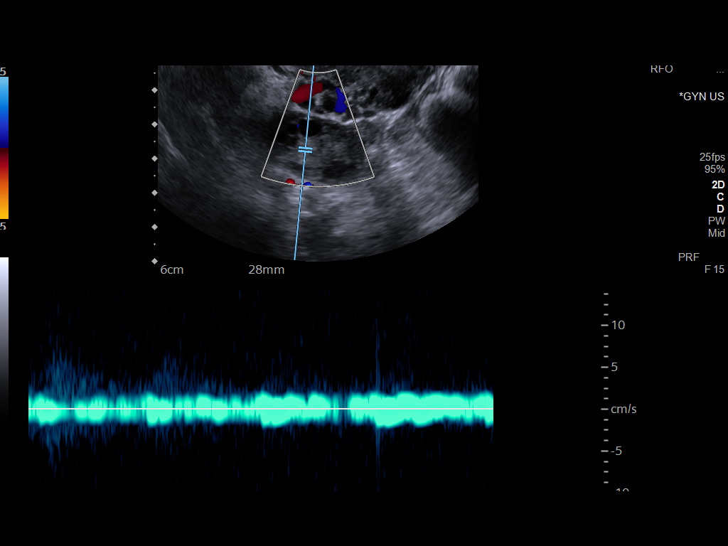
[im 89/89]
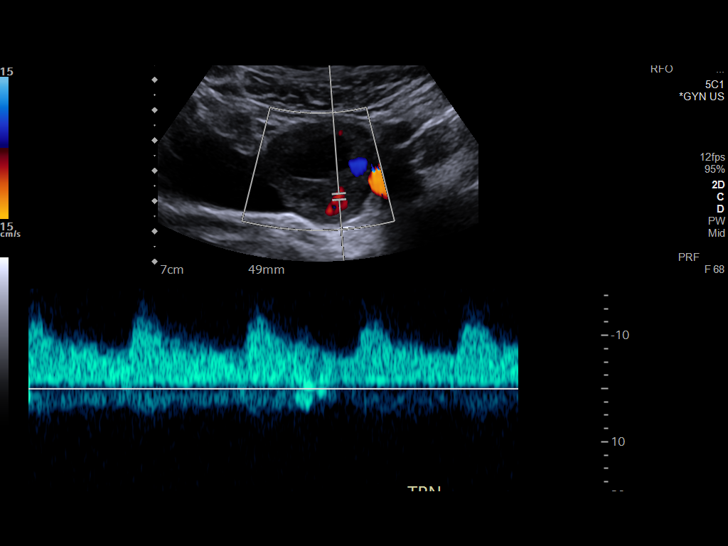

[13 of 25 positions shown; findings below may reference images not displayed]

FINDINGS: Uterus

Measurements: 9.8 x 6.2 x 7.0 cm = volume: 223.2 mL. Two fibroids
are identified. One anteriorly on the right measures 1.9 cm and a
second posteriorly on the left measures 2.4 cm.

Endometrium

Thickness: 0.7 cm.  No focal abnormality visualized.

Right ovary

Measurements: 2.7 x 1.9 x 2.0 cm = volume: 5.4 mL. Normal
appearance/no adnexal mass.

Left ovary

Measurements: 4.1 x 4.3 x 1.9 cm = volume: 18.5 mL. Normal
appearance/no adnexal mass.

Pulsed Doppler evaluation of both ovaries demonstrates normal
low-resistance arterial and venous waveforms.

Other findings

No abnormal free fluid.
IMPRESSION: Negative for ovarian torsion.  Negative exam.

## 2021-04-29 ENCOUNTER — Encounter: Payer: Self-pay | Admitting: *Deleted

## 2021-05-01 ENCOUNTER — Encounter: Payer: Self-pay | Admitting: Advanced Practice Midwife

## 2021-05-01 ENCOUNTER — Other Ambulatory Visit: Payer: Self-pay

## 2021-05-01 ENCOUNTER — Ambulatory Visit: Payer: 59 | Admitting: Advanced Practice Midwife

## 2021-05-01 VITALS — BP 119/79 | HR 78 | Ht 62.0 in | Wt 151.0 lb

## 2021-05-01 DIAGNOSIS — N921 Excessive and frequent menstruation with irregular cycle: Secondary | ICD-10-CM | POA: Diagnosis not present

## 2021-05-01 DIAGNOSIS — Z3202 Encounter for pregnancy test, result negative: Secondary | ICD-10-CM | POA: Diagnosis not present

## 2021-05-01 DIAGNOSIS — Z975 Presence of (intrauterine) contraceptive device: Secondary | ICD-10-CM | POA: Diagnosis not present

## 2021-05-01 DIAGNOSIS — N939 Abnormal uterine and vaginal bleeding, unspecified: Secondary | ICD-10-CM | POA: Diagnosis not present

## 2021-05-01 LAB — POCT URINE PREGNANCY: Preg Test, Ur: NEGATIVE

## 2021-05-01 MED ORDER — ELURYNG 0.12-0.015 MG/24HR VA RING: 1.0000 | VAGINAL_RING | VAGINAL | 6 refills | Status: DC

## 2021-05-01 NOTE — Progress Notes (Signed)
? ?  GYN VISIT ?Patient name: Wanda Young MRN [id number]  Date of birth: Jun 27, 1985 ?Chief Complaint:   ?Vaginal Bleeding (Took abortion pill on 03/02/21 still bleeding, started nuvaring 2/21) ? ?History of Present Illness:   ?Darrel Baroni is a 36 y.o. [id number] African-American female being seen today for irreg bldg s/p induced AB 03/02/21 with pill. States was 10wks and was 'ectopic.'  She started NuvaRing on 04/14/21 and has been having irreg bldg since then (pink/red/brown). ?No LMP recorded. ?The current method of family planning is NuvaRing vaginal inserts.  ?Last pap March 2021. Results were: NILM w/ HRHPV negative ? ?Depression screen [facility] 2/9 11/19/2020 05/02/2019  ?Decreased Interest 1 2  ?Down, Depressed, Hopeless 1 2  ?PHQ - 2 Score 2 4  ?Altered sleeping 1 2  ?Tired, decreased energy 1 2  ?Change in appetite 1 2  ?Feeling bad or failure about yourself  1 1  ?Trouble concentrating 1 2  ?Moving slowly or fidgety/restless 0 0  ?Suicidal thoughts 0 0  ?PHQ-9 Score 7 13  ? ?  ?GAD 7 : Generalized Anxiety Score 11/19/2020  ?Nervous, Anxious, on Edge 1  ?Control/stop worrying 1  ?Worry too much - different things 1  ?Trouble relaxing 1  ?Restless 1  ?Easily annoyed or irritable 1  ?Afraid - awful might happen 0  ?Total GAD 7 Score 6  ? ? ? ?Review of Systems:   ?Pertinent items are noted in HPI ?Denies fever/chills, dizziness, headaches, visual disturbances, fatigue, shortness of breath, chest pain, abdominal pain, vomiting, abnormal vaginal discharge/itching/odor/irritation, problems with periods, bowel movements, urination, or intercourse unless otherwise stated above.  ?Pertinent History Reviewed:  ?Reviewed past medical,surgical, social, obstetrical and family history.  ?Reviewed problem list, medications and allergies. ?Physical Assessment:  ? ?Vitals:  ? 05/01/21 1100  ?BP: 119/79  ?Pulse: 78  ?Weight: 151 lb (68.5 kg)  ?Height: 5\' 2"  (1.575 m)  ?Body mass index is 27.62 kg/m?. ? ?     Physical Examination:   ? General appearance: alert, well appearing, and in no distress ? Mental status: alert, oriented to person, place, and time ? Skin: warm & dry  ? Cardiovascular: normal heart rate noted ? Respiratory: normal respiratory effort, no distress ? Abdomen: soft, non-tender  ? Pelvic: normal external genitalia, vulva, vagina, cervix, uterus and adnexa; sm tan d/c; NuvaRing in place ? Extremities: no edema  ? ? ?Results for orders placed or performed in visit on 05/01/21 (from the past 24 hour(s))  ?POCT urine pregnancy  ? Collection Time: 05/01/21 11:11 AM  ?Result Value Ref Range  ? Preg Test, Ur Negative Negative  ?  ?Assessment & Plan:  ?1) s/p IAB Jan 2023> at approx 10wks, 'ectopic', using pill ? ?2) New start NuvaRing with BTB> on first insert since IAB; give it 3 months to become more regular ? ?Meds:  ?Meds ordered this encounter  ?Medications  ? ELURYNG 0.12-0.015 MG/24HR vaginal ring  ?  Sig: Place 1 each vaginally every 28 (twenty-eight) days.  ?  Dispense:  1 each  ?  Refill:  6  ?  Order Specific Question:   Supervising Provider  ?  Answer:   03-20-1973 H [[id number]]  ? ? ?Orders Placed This Encounter  ?Procedures  ? POCT urine pregnancy  ? ? ?Return for physical Sept 2023. ? ?11-18-2001 CNM ?05/01/2021 ?11:32 AM  ?

## 2021-07-17 DIAGNOSIS — J069 Acute upper respiratory infection, unspecified: Secondary | ICD-10-CM | POA: Diagnosis not present

## 2021-07-17 DIAGNOSIS — R070 Pain in throat: Secondary | ICD-10-CM | POA: Diagnosis not present

## 2021-12-31 IMAGING — DX DG CHEST 2V
2 series · 2 of 2 positions shown · non-contrast
Comparison: None.

CLINICAL DATA: Chest tightness. Patient tested positive for
COVID-13 March, 2019.

EXAM:
CHEST - 2 VIEW

[chest pa]
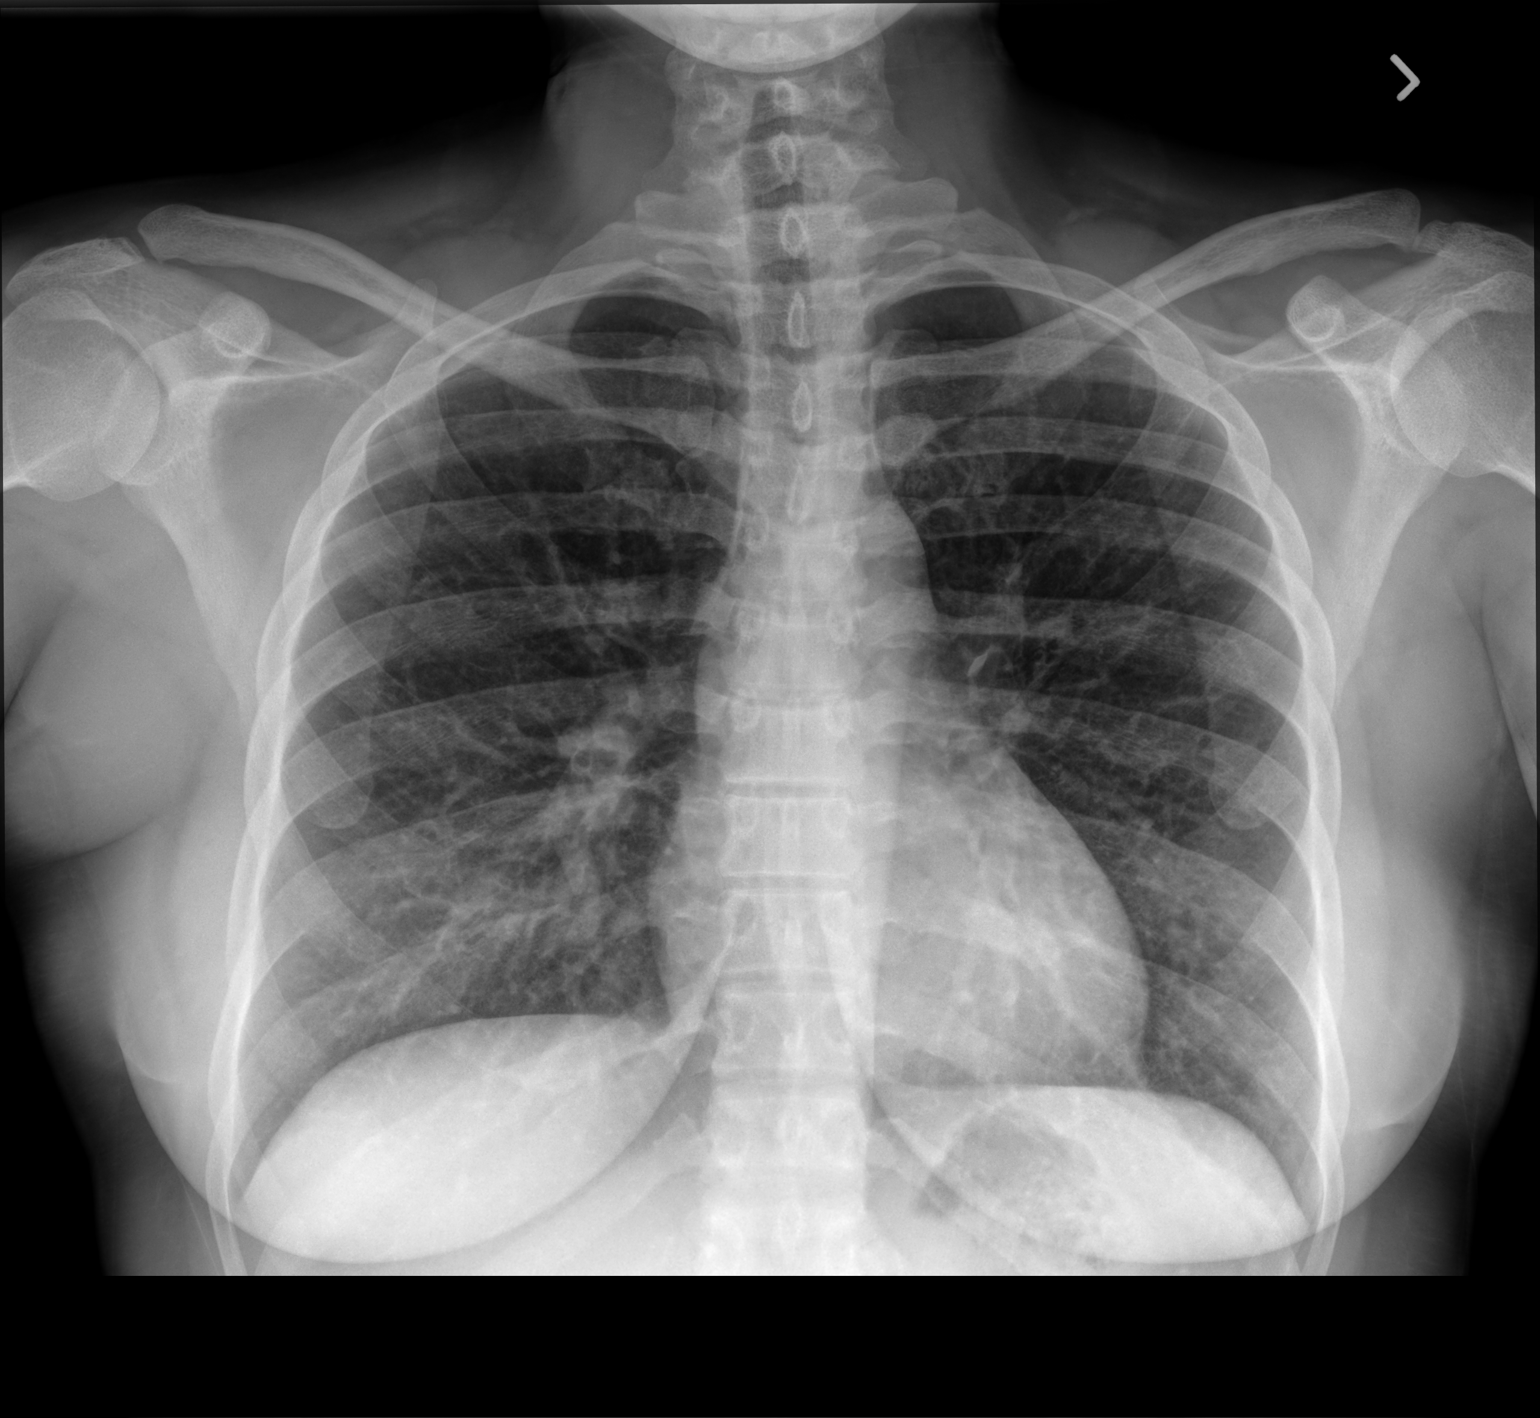

[chest lat]
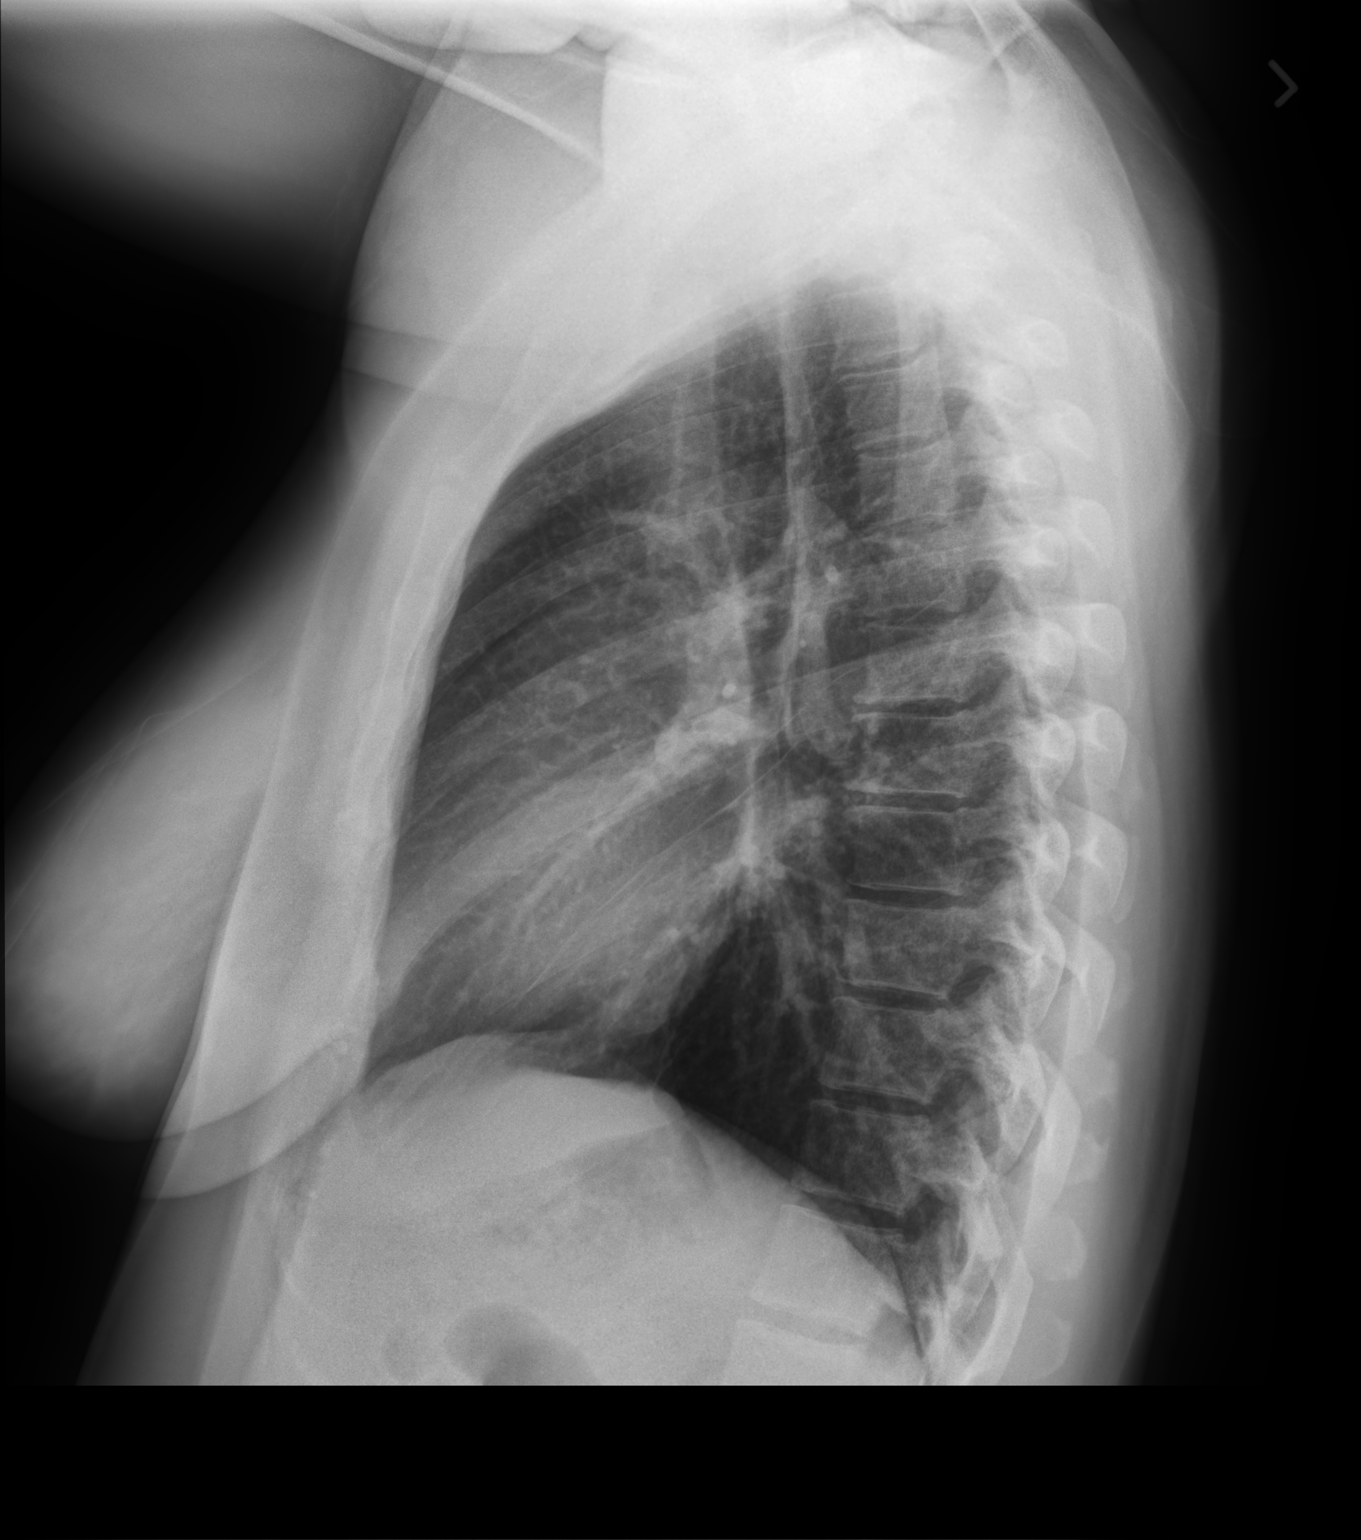

[2 of 2 positions shown; findings below may reference images not displayed]

FINDINGS: The heart size and mediastinal contours are within normal limits.
Both lungs are clear. The visualized skeletal structures are
unremarkable.
IMPRESSION: No active cardiopulmonary disease.

## 2022-03-02 ENCOUNTER — Ambulatory Visit: Payer: Commercial Managed Care - PPO | Admitting: Family Medicine

## 2022-03-02 DIAGNOSIS — N63 Unspecified lump in unspecified breast: Secondary | ICD-10-CM | POA: Insufficient documentation

## 2022-03-02 DIAGNOSIS — F419 Anxiety disorder, unspecified: Secondary | ICD-10-CM | POA: Diagnosis not present

## 2022-03-02 DIAGNOSIS — N6341 Unspecified lump in right breast, subareolar: Secondary | ICD-10-CM | POA: Diagnosis not present

## 2022-03-02 NOTE — Assessment & Plan Note (Signed)
Discussed counseling and medication.  Patient will consider.

## 2022-03-02 NOTE — Progress Notes (Signed)
Subjective:  Patient ID: Wanda Young, female    DOB: 1985-10-02  Age: 37 y.o. MRN: [id number]  CC: Chief Complaint  Patient presents with   Annual Exam    Patient would like to set up a mammogram. Patient states she does have a gyn.    HPI:  37 year old female presents for evaluation of the above.  Patient states that she has been experiencing pain of the left breast.  This has been going on for the past few months.  She also has an area of concern of the right breast.  She states it is located directly underneath the nipple.  She noticed this about 3 weeks ago.  It is not painful.  However, this area is not present on her left breast so she is concerned.  She has a family history of breast cancer in her paternal grandmother who was diagnosed in her 57s to 88s.  No nipple discharge.  Patient also reports ongoing anxiety.  PHQ 9 score of 9.  GAD-7 score of 13.  Will discuss treatment options today.  Social Hx   Social History   Socioeconomic History   Marital status: Single    Spouse name: Not on file   Number of children: Not on file   Years of education: Not on file   Highest education level: Not on file  Occupational History   Not on file  Tobacco Use   Smoking status: Never   Smokeless tobacco: Never  Vaping Use   Vaping Use: Never used  Substance and Sexual Activity   Alcohol use: Yes    Comment: 2-3 glasses a month   Drug use: Never   Sexual activity: Yes    Birth control/protection: None  Other Topics Concern   Not on file  Social History Narrative   Not on file   Social Determinants of Health   Financial Resource Strain: Medium Risk (11/19/2020)   Overall Financial Resource Strain (CARDIA)    Difficulty of Paying Living Expenses: Somewhat hard  Food Insecurity: No Food Insecurity (11/19/2020)   Hunger Vital Sign    Worried About Running Out of Food in the Last Year: Never true    Ran Out of Food in the Last Year: Never true  Transportation Needs: No  Transportation Needs (11/19/2020)   PRAPARE - Hydrologist (Medical): No    Lack of Transportation (Non-Medical): No  Physical Activity: Insufficiently Active (11/19/2020)   Exercise Vital Sign    Days of Exercise per Week: 1 day    Minutes of Exercise per Session: 30 min  Stress: Stress Concern Present (11/19/2020)   [place]    Feeling of Stress : To some extent  Social Connections: Moderately Isolated (11/19/2020)   Social Connection and Isolation Panel [NHANES]    Frequency of Communication with Friends and Family: More than three times a week    Frequency of Social Gatherings with Friends and Family: Once a week    Attends Religious Services: Never    Marine scientist or Organizations: No    Attends Music therapist: Never    Marital Status: Living with partner    Review of Systems Per HPI  Objective:  BP 120/84   Pulse 71   Temp 98.4 F (36.9 C) (Oral)   Ht 5' 1.25" (1.556 m)   Wt 159 lb (72.1 kg)   LMP 02/01/2022 (Exact Date)   SpO2 99%  BMI 29.80 kg/m      03/02/2022    9:19 AM 05/01/2021   11:00 AM 11/19/2020   10:26 AM  BP/Weight  Systolic BP 120 119 123  Diastolic BP 84 79 81  Wt. (Lbs) 159 151 153  BMI 29.8 kg/m2 27.62 kg/m2 27.98 kg/m2    Physical Exam Vitals and nursing note reviewed.  Constitutional:      General: She is not in acute distress.    Appearance: Normal appearance.  HENT:     Head: Normocephalic and atraumatic.  Cardiovascular:     Rate and Rhythm: Normal rate and regular rhythm.  Pulmonary:     Effort: Pulmonary effort is normal.     Breath sounds: Normal breath sounds. No wheezing, rhonchi or rales.  Chest:       Comments: Chaperone, [place] CMA present during breast exam. Very small palpable firm area located just beneath the nipple at the 6 o'clock position.  Neurological:     Mental Status: She is alert.   Psychiatric:        Mood and Affect: Mood normal.        Behavior: Behavior normal.     Lab Results  Component Value Date   WBC 6.0 03/25/2018   HGB 13.8 03/25/2018   HCT 44.2 03/25/2018   PLT 216 03/25/2018   GLUCOSE 104 (H) 03/25/2018   ALT 15 03/25/2018   AST 14 (L) 03/25/2018   NA 137 03/25/2018   K 3.9 03/25/2018   CL 106 03/25/2018   CREATININE 0.85 03/25/2018   BUN 11 03/25/2018   CO2 25 03/25/2018     Assessment & Plan:   Problem List Items Addressed This Visit       Other   Breast lump or mass    Arranging mammogram and [place] for further evaluation.  Suspect that this is benign.      Relevant Orders   MM Digital Diagnostic Unilat R   US BREAST COMPLETE UNI RIGHT INC AXILLA   Anxiety    Discussed counseling and medication.  Patient will consider.      Everlene Other DO [facility]

## 2022-03-02 NOTE — Patient Instructions (Signed)
Consider medication for your anxiety.  We will set up mammogram and [place].   Take care  Dr. Lacinda Axon

## 2022-03-02 NOTE — Assessment & Plan Note (Addendum)
Arranging mammogram and [place] for further evaluation.  Suspect that this is benign.

## 2022-03-03 ENCOUNTER — Other Ambulatory Visit (HOSPITAL_COMMUNITY): Payer: Self-pay | Admitting: Family Medicine

## 2022-03-03 DIAGNOSIS — N6310 Unspecified lump in the right breast, unspecified quadrant: Secondary | ICD-10-CM

## 2022-03-23 ENCOUNTER — Ambulatory Visit (HOSPITAL_COMMUNITY)
Admission: RE | Admit: 2022-03-23 | Discharge: 2022-03-23 | Disposition: A | Discharge status: Home / Self Care | Payer: Commercial Managed Care - PPO | Source: Ambulatory Visit | Attending: Family Medicine | Admitting: Family Medicine

## 2022-03-23 ENCOUNTER — Encounter (HOSPITAL_COMMUNITY): Payer: Self-pay

## 2022-03-23 DIAGNOSIS — N6341 Unspecified lump in right breast, subareolar: Secondary | ICD-10-CM | POA: Insufficient documentation

## 2022-03-23 DIAGNOSIS — N6310 Unspecified lump in the right breast, unspecified quadrant: Secondary | ICD-10-CM

## 2022-03-23 DIAGNOSIS — N6001 Solitary cyst of right breast: Secondary | ICD-10-CM | POA: Diagnosis not present

## 2022-03-23 DIAGNOSIS — R922 Inconclusive mammogram: Secondary | ICD-10-CM | POA: Diagnosis not present

## 2022-05-28 ENCOUNTER — Ambulatory Visit: Payer: Commercial Managed Care - PPO | Admitting: Advanced Practice Midwife

## 2022-08-09 ENCOUNTER — Ambulatory Visit: Payer: Commercial Managed Care - PPO | Admitting: Family Medicine

## 2022-08-09 VITALS — BP 118/80 | HR 68 | Temp 98.4°F | Ht 61.0 in | Wt 161.4 lb

## 2022-08-09 DIAGNOSIS — Z0001 Encounter for general adult medical examination with abnormal findings: Secondary | ICD-10-CM | POA: Diagnosis not present

## 2022-08-09 DIAGNOSIS — Z111 Encounter for screening for respiratory tuberculosis: Secondary | ICD-10-CM

## 2022-08-09 DIAGNOSIS — Z0000 Encounter for general adult medical examination without abnormal findings: Secondary | ICD-10-CM | POA: Insufficient documentation

## 2022-08-09 NOTE — Assessment & Plan Note (Addendum)
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 Needs screening TB gold.  Needs follow-up with OB/GYN for Pap.

## 2022-08-09 NOTE — Patient Instructions (Signed)
We will call when you can pick up the form.  Lab has been ordered.  Take care  Dr. Adriana Simas

## 2022-08-09 NOTE — Progress Notes (Signed)
Subjective:  Patient ID: Wanda Young, female    DOB: Aug 24, 1985  Age: 37 y.o. MRN: [id number]  CC: Chief Complaint  Patient presents with   school form    HPI:  37 year old female presents for evaluation of the above.  Patient states that she is in need of a physical.  She is going to nursing school.  She needs form filled out regarding her immunizations and titers.  Patient states that she is doing well.  She has had prior issues with her breast but these have resolved.  She does state that she has not underlying anxiety but states that she is coping.  Patient Active Problem List   Diagnosis Date Noted   Annual physical exam 08/09/2022   Anxiety 03/02/2022    Social Hx   Social History   Socioeconomic History   Marital status: Single    Spouse name: Not on file   Number of children: Not on file   Years of education: Not on file   Highest education level: Not on file  Occupational History   Not on file  Tobacco Use   Smoking status: Never   Smokeless tobacco: Never  Vaping Use   Vaping Use: Never used  Substance and Sexual Activity   Alcohol use: Yes    Comment: 2-3 glasses a month   Drug use: Never   Sexual activity: Yes    Birth control/protection: None  Other Topics Concern   Not on file  Social History Narrative   Not on file   Social Determinants of Health   Financial Resource Strain: Medium Risk (11/19/2020)   Overall Financial Resource Strain (CARDIA)    Difficulty of Paying Living Expenses: Somewhat hard  Food Insecurity: No Food Insecurity (11/19/2020)   Hunger Vital Sign    Worried About Running Out of Food in the Last Year: Never true    Ran Out of Food in the Last Year: Never true  Transportation Needs: No Transportation Needs (11/19/2020)   PRAPARE - Administrator, Civil Service (Medical): No    Lack of Transportation (Non-Medical): No  Physical Activity: Insufficiently Active (11/19/2020)   Exercise Vital Sign    Days of  Exercise per Week: 1 day    Minutes of Exercise per Session: 30 min  Stress: Stress Concern Present (11/19/2020)   [employer] of Occupational Health - Occupational Stress Questionnaire    Feeling of Stress : To some extent  Social Connections: Moderately Isolated (11/19/2020)   Social Connection and Isolation Panel [NHANES]    Frequency of Communication with Friends and Family: More than three times a week    Frequency of Social Gatherings with Friends and Family: Once a week    Attends Religious Services: Never    Database administrator or Organizations: No    Attends Banker Meetings: Never    Marital Status: Living with partner    Review of Systems  Constitutional: Negative.   Cardiovascular: Negative.   Psychiatric/Behavioral:  The patient is nervous/anxious.     Objective:  BP 118/80   Pulse 68   Temp 98.4 F (36.9 C)   Ht 5\' 1"  (1.549 m)   Wt 161 lb 6.4 oz (73.2 kg)   LMP 06/29/2022   SpO2 98%   BMI 30.50 kg/m      08/09/2022   10:03 AM 03/02/2022    9:19 AM 05/01/2021   11:00 AM  BP/Weight  Systolic BP 118 120 119  Diastolic BP 80  84 79  Wt. (Lbs) 161.4 159 151  BMI 30.5 kg/m2 29.8 kg/m2 27.62 kg/m2    Physical Exam Vitals and nursing note reviewed.  Constitutional:      General: She is not in acute distress.    Appearance: Normal appearance.  HENT:     Head: Normocephalic and atraumatic.  Eyes:     General:        Right eye: No discharge.        Left eye: No discharge.     Conjunctiva/sclera: Conjunctivae normal.  Cardiovascular:     Rate and Rhythm: Normal rate and regular rhythm.  Pulmonary:     Effort: Pulmonary effort is normal.     Breath sounds: Normal breath sounds. No wheezing, rhonchi or rales.  Neurological:     Mental Status: She is alert.  Psychiatric:        Mood and Affect: Mood normal.        Behavior: Behavior normal.     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 Needs screening TB gold.  Needs follow-up with OB/GYN for Pap.      Other Visit Diagnoses     Screening-pulmonary TB       Relevant Orders   QuantiFERON-TB Gold Plus      Tera Pellicane DO [facility]

## 2022-08-13 LAB — QUANTIFERON-TB GOLD PLUS
QuantiFERON Mitogen Value: >10 IU/mL
QuantiFERON Nil Value: 0.01 IU/mL
QuantiFERON TB1 Ag Value: 0.03 IU/mL
QuantiFERON TB2 Ag Value: 0.02 IU/mL
QuantiFERON-TB Gold Plus: NEGATIVE

## 2022-09-22 ENCOUNTER — Encounter (HOSPITAL_COMMUNITY): Payer: Self-pay

## 2022-09-22 ENCOUNTER — Ambulatory Visit (HOSPITAL_COMMUNITY)
Admission: EM | Admit: 2022-09-22 | Discharge: 2022-09-22 | Disposition: A | Discharge status: Home / Self Care | Payer: Commercial Managed Care - PPO | Attending: Emergency Medicine | Admitting: Emergency Medicine

## 2022-09-22 DIAGNOSIS — J029 Acute pharyngitis, unspecified: Secondary | ICD-10-CM | POA: Insufficient documentation

## 2022-09-22 DIAGNOSIS — R0789 Other chest pain: Secondary | ICD-10-CM | POA: Diagnosis present

## 2022-09-22 DIAGNOSIS — R509 Fever, unspecified: Secondary | ICD-10-CM | POA: Diagnosis present

## 2022-09-22 DIAGNOSIS — Z1152 Encounter for screening for COVID-19: Secondary | ICD-10-CM | POA: Insufficient documentation

## 2022-09-22 DIAGNOSIS — J3489 Other specified disorders of nose and nasal sinuses: Secondary | ICD-10-CM | POA: Insufficient documentation

## 2022-09-22 LAB — POCT RAPID STREP A (OFFICE): Rapid Strep A Screen: NEGATIVE

## 2022-09-22 NOTE — ED Triage Notes (Signed)
Patient here today with c/o ST, tightness in chest, fever, chills, sweats, body aches X 1 day. She has been taking Tylenol, Mucinex, Sudafed, a nasal spray, and using a Nedi Pot with temporary relief. She works in the hospital.

## 2022-09-22 NOTE — Discharge Instructions (Addendum)
Overall your symptoms were consistent with a viral illness.  We have swabbed you for COVID-19 and we will contact you if the results are positive.  Please continue symptomatic management at home getting plenty of rest, ensure you are drinking at least 64 ounces of fluid, and alternating between ibuprofen and Tylenol for any fever, chills or bodyaches.  For symptomatic relief of your sore throat you can do warm saline gargles, drink tea with honey, and sleep with a humidifier.  We have also sent your strep swab off for culture and we will contact you if antibiotics are indicated.    Your symptoms should improve over the next 5 to 7 days, if you have any changes, or new concerning symptoms, please follow-up with your primary care provider or return to the clinic.

## 2022-09-22 NOTE — ED Provider Notes (Signed)
[facility]    CSN: [id number] Arrival date & time: 09/22/22  0801      History   Chief Complaint Chief Complaint  Patient presents with   Sore Throat    HPI Wanda Young is a 37 y.o. female.   Patient presents to clinic for complaints of sore throat, fever, chills, sweats and bodyaches that started yesterday.  At home she has been taking Tylenol, Mucinex, Sudafed, and nasal spray and using a Nettie pot.  She does work at the hospital.  She has been having ongoing nasal drainage for the past month, she does not take an antihistamine daily, takes it as needed.  She denies any wheezing, chest pain or shortness of breath.  She did try testing for COVID-19 at home, thinks her test was and affected because no results came back at all.    The history is provided by the patient and medical records.  Sore Throat Pertinent negatives include no chest pain and no shortness of breath.    History reviewed. No pertinent past medical history.  Patient Active Problem List   Diagnosis Date Noted   Annual physical exam 08/09/2022   Anxiety 03/02/2022    History reviewed. No pertinent surgical history.  OB History     Gravida  4   Para  2   Term  2   Preterm      AB  2   Living  2      SAB      IAB  2   Ectopic      Multiple      Live Births  2            Home Medications    Prior to Admission medications   Medication Sig Start Date End Date Taking? Authorizing Provider  cholecalciferol (VITAMIN D3) 25 MCG (1000 UNIT) tablet Take 1,000 Units by mouth daily.   Yes [provider]  Multiple Vitamin (MULTIVITAMIN) capsule Take 1 capsule by mouth daily.   Yes [provider]    Family History Family History  Problem Relation Age of Onset   Rheum arthritis Mother    Asthma Mother    Diabetes Father    Hypertension Father    COPD Father    Heart attack Paternal Grandfather    Breast cancer Paternal Grandmother    COPD  Maternal Grandmother    Diabetes Maternal Grandmother    Hypertension Maternal Grandmother    Asthma Brother    Healthy Sister    Healthy Sister     Social History Social History   Tobacco Use   Smoking status: Never   Smokeless tobacco: Never  Vaping Use   Vaping status: Never Used  Substance Use Topics   Alcohol use: Yes    Comment: 2-3 glasses a month   Drug use: Never     Allergies   Patient has no known allergies.   Review of Systems Review of Systems  Constitutional:  Positive for chills and fever.  HENT:  Positive for congestion, postnasal drip and sore throat. Negative for trouble swallowing.   Respiratory:  Negative for cough and shortness of breath.   Cardiovascular:  Negative for chest pain.     Physical Exam Triage Vital Signs ED Triage Vitals  Encounter Vitals Group     BP 09/22/22 0820 122/84     Systolic BP Percentile --      Diastolic BP Percentile --      Pulse Rate 09/22/22 0820  86     Resp 09/22/22 0820 16     Temp 09/22/22 0820 99.2 F (37.3 C)     Temp Source 09/22/22 0820 Oral     SpO2 09/22/22 0820 98 %     Weight 09/22/22 0819 163 lb (73.9 kg)     Height 09/22/22 0819 5\' 2"  (1.575 m)     Head Circumference --      Peak Flow --      Pain Score 09/22/22 0817 5     Pain Loc --      Pain Education --      Exclude from Growth Chart --    No data found.  Updated Vital Signs BP 122/84 (BP Location: Right Arm)   Pulse 86   Temp 99.2 F (37.3 C) (Oral)   Resp 16   Ht 5\' 2"  (1.575 m)   Wt 163 lb (73.9 kg)   LMP 09/14/2022 (Exact Date)   SpO2 98%   BMI 29.81 kg/m   Visual Acuity Right Eye Distance:   Left Eye Distance:   Bilateral Distance:    Right Eye Near:   Left Eye Near:    Bilateral Near:     Physical Exam Vitals and nursing note reviewed.  Constitutional:      Appearance: She is well-developed.  HENT:     Head: Normocephalic and atraumatic.     Nose: Congestion and rhinorrhea present.     Mouth/Throat:      Mouth: Mucous membranes are moist.     Pharynx: Uvula midline. Posterior oropharyngeal erythema present.     Tonsils: No tonsillar exudate or tonsillar abscesses. 1+ on the right. 1+ on the left.  Eyes:     Conjunctiva/sclera: Conjunctivae normal.  Cardiovascular:     Rate and Rhythm: Normal rate and regular rhythm.     Heart sounds: Normal heart sounds. No murmur heard. Pulmonary:     Effort: Pulmonary effort is normal.     Breath sounds: Normal breath sounds.  Musculoskeletal:     Cervical back: Normal range of motion.  Lymphadenopathy:     Cervical: Cervical adenopathy present.  Skin:    General: Skin is warm and dry.  Neurological:     General: No focal deficit present.     Mental Status: She is alert and oriented to person, place, and time.  Psychiatric:        Mood and Affect: Mood normal.        Behavior: Behavior normal.      UC Treatments / Results  Labs (all labs ordered are listed, but only abnormal results are displayed) Labs Reviewed  SARS CORONAVIRUS 2 (TAT 6-24 HRS)  CULTURE, GROUP A STREP [facility])  POCT RAPID STREP A (OFFICE)    EKG   Radiology No results found.  Procedures Procedures (including critical care time)  Medications Ordered in UC Medications - No data to display  Initial Impression / Assessment and Plan / UC Course  I have reviewed the triage vital signs and the nursing notes.  Pertinent labs & imaging results that were available during my care of the patient were reviewed by me and considered in my medical decision making (see chart for details).  Vitals and triage reviewed, patient is hemodynamically stable.  Presents with symptoms consistent with viral illness pharyngitis, body aches, fever and chills.  Lungs are vesicular posteriorly, heart with regular rate and rhythm.  Staff swab for strep, negative, will send for culture to ensure antibiotics are not indicated.  Given 19 swab obtained.  Symptomatic management for viral illness  discussed.  Plan of care, follow-up care and return precautions given, no questions at this time.    Final Clinical Impressions(s) / UC Diagnoses   Final diagnoses:  Viral pharyngitis  Nasal drainage     Discharge Instructions      Overall your symptoms were consistent with a viral illness.  We have swabbed you for COVID-19 and we will contact you if the results are positive.  Please continue symptomatic management at home getting plenty of rest, ensure you are drinking at least 64 ounces of fluid, and alternating between ibuprofen and Tylenol for any fever, chills or bodyaches.  For symptomatic relief of your sore throat you can do warm saline gargles, drink tea with honey, and sleep with a humidifier.  We have also sent your strep swab off for culture and we will contact you if antibiotics are indicated.    Your symptoms should improve over the next 5 to 7 days, if you have any changes, or new concerning symptoms, please follow-up with your primary care provider or return to the clinic.      ED Prescriptions   None    PDMP not reviewed this encounter.   Rinaldo Ratel [place], [place] 09/22/22 [phone]

## 2023-01-11 ENCOUNTER — Ambulatory Visit: Payer: Commercial Managed Care - PPO | Admitting: Adult Health

## 2023-01-11 ENCOUNTER — Encounter: Payer: Self-pay | Admitting: Adult Health

## 2023-01-11 VITALS — BP 121/80 | HR 70 | Ht 62.0 in | Wt 165.0 lb

## 2023-01-11 DIAGNOSIS — Z872 Personal history of diseases of the skin and subcutaneous tissue: Secondary | ICD-10-CM | POA: Diagnosis not present

## 2023-01-11 DIAGNOSIS — N764 Abscess of vulva: Secondary | ICD-10-CM | POA: Insufficient documentation

## 2023-01-11 MED ORDER — SULFAMETHOXAZOLE-TRIMETHOPRIM 800-160 MG PO TABS: 1.0000 | ORAL_TABLET | Freq: Two times a day (BID) | ORAL | 0 refills | Status: DC

## 2023-01-11 MED ORDER — SILVER SULFADIAZINE 1 % EX CREA: 1.0000 | TOPICAL_CREAM | Freq: Two times a day (BID) | CUTANEOUS | 0 refills | Status: DC

## 2023-01-11 NOTE — Progress Notes (Signed)
  Subjective:     Patient ID: Wanda Young, female   DOB: May 22, 1985, 37 y.o.   MRN: [id number]  HPI Wanda Young is a 37 year old black female,single, [id number] in complaining of ?ingrown hair. About a month ago she pulled hair out of bump and it had some pus too, then it went down but is bigger now.     Component Value Date/Time   DIAGPAP  05/02/2019 1453    - Negative for intraepithelial lesion or malignancy (NILM)   HPVHIGH Negative 05/02/2019 1453   ADEQPAP  05/02/2019 1453    Satisfactory for evaluation; transformation zone component PRESENT.    PCP is Dr Adriana Simas  Review of Systems ?ingrown hair Reviewed past medical,surgical, social and family history. Reviewed medications and allergies.     Objective:   Physical Exam BP 121/80 (BP Location: Right Arm, Patient Position: Sitting, Cuff Size: Normal)   Pulse 70   Ht 5\' 2"  (1.575 m)   Wt 165 lb (74.8 kg)   LMP 12/24/2022   BMI 30.18 kg/m     Skin warm and dry.Pelvic: external genitalia is normal in appearance, has 5 mm bump left labia and has firm 2  x 3 cm boil like area left labia. No lymph nodes felt in groin area.   Upstream - 01/11/23 1110       Pregnancy Intention Screening   Does the patient want to become pregnant in the next year? Ok Either Way    Does the patient's partner want to become pregnant in the next year? Ok Either Way    Would the patient like to discuss contraceptive options today? No      Contraception Wrap Up   Current Method No Contraceptive Precautions    End Method No Contraception Precautions    Contraception Counseling Provided No            Examination chaperoned by Faith Rogue LPN  Assessment:     1. Vulval boil Will rx septra ds 1 bid x 14 days and use silvadene bid  Can apply warm compress but do not squeeze Meds ordered this encounter  Medications   sulfamethoxazole-trimethoprim (BACTRIM DS) 800-160 MG tablet    Sig: Take 1 tablet by mouth 2 (two) times daily. Take 1 bid     Dispense:  28 tablet    Refill:  0    Order Specific Question:   Supervising Provider    Answer:   Duane Lope H [[id number]]   silver sulfADIAZINE (SILVADENE) 1 % cream    Sig: Apply 1 Application topically 2 (two) times daily.    Dispense:  50 g    Refill:  0    Order Specific Question:   Supervising Provider    Answer:   Lazaro Arms [[id number]]    Will recheck 02/04/23  2. History of ingrown hair Do not shave for now    Plan:     Return 02/04/23 for follow up and pap

## 2023-02-04 ENCOUNTER — Ambulatory Visit: Payer: Commercial Managed Care - PPO | Admitting: Adult Health

## 2023-02-04 ENCOUNTER — Encounter: Payer: Self-pay | Admitting: Adult Health

## 2023-02-04 ENCOUNTER — Other Ambulatory Visit (HOSPITAL_COMMUNITY)
Admission: RE | Admit: 2023-02-04 | Discharge: 2023-02-04 | Disposition: A | Discharge status: Home / Self Care | Payer: Commercial Managed Care - PPO | Source: Ambulatory Visit | Attending: Adult Health | Admitting: Adult Health

## 2023-02-04 VITALS — BP 121/86 | HR 77 | Ht 62.0 in | Wt 168.0 lb

## 2023-02-04 DIAGNOSIS — Z1331 Encounter for screening for depression: Secondary | ICD-10-CM | POA: Diagnosis not present

## 2023-02-04 DIAGNOSIS — F32A Depression, unspecified: Secondary | ICD-10-CM | POA: Diagnosis not present

## 2023-02-04 DIAGNOSIS — Z01419 Encounter for gynecological examination (general) (routine) without abnormal findings: Secondary | ICD-10-CM | POA: Insufficient documentation

## 2023-02-04 DIAGNOSIS — N764 Abscess of vulva: Secondary | ICD-10-CM

## 2023-02-04 DIAGNOSIS — F419 Anxiety disorder, unspecified: Secondary | ICD-10-CM | POA: Diagnosis not present

## 2023-02-04 DIAGNOSIS — Z30015 Encounter for initial prescription of vaginal ring hormonal contraceptive: Secondary | ICD-10-CM | POA: Insufficient documentation

## 2023-02-04 MED ORDER — DOXYCYCLINE HYCLATE 100 MG PO TABS
100.0000 mg | ORAL_TABLET | Freq: Two times a day (BID) | ORAL | 0 refills | Status: DC
Start: 2023-02-04 — End: 2023-05-06

## 2023-02-04 MED ORDER — ETONOGESTREL-ETHINYL ESTRADIOL 0.12-0.015 MG/24HR VA RING: VAGINAL_RING | VAGINAL | 12 refills | Status: DC

## 2023-02-04 NOTE — Progress Notes (Signed)
Patient ID: Wanda Young, female   DOB: 1985-05-09, 37 y.o.   MRN: [id number] History of Present Illness: Wanda Young is a 37 year old black female,with SO, [id number] in for a well woman gyn exam and pap, and follow up on vulva boil, it is better. She wants to discuss birth control.  PCP is Dr Adriana Simas   Current Medications, Allergies, Past Medical History, Past Surgical History, Family History and Social History were reviewed in [employer] electronic medical record.     Review of Systems: Patient denies any headaches, hearing loss, fatigue, blurred vision, shortness of breath, chest pain, abdominal pain, problems with bowel movements, urination, or intercourse. No joint pain or mood swings.  Boil is better   Physical Exam:BP 121/86 (BP Location: Left Arm, Patient Position: Sitting, Cuff Size: Large)   Pulse 77   Ht 5\' 2"  (1.575 m)   Wt 168 lb (76.2 kg)   LMP 01/21/2023 (Exact Date)   BMI 30.73 kg/m   General:  Well developed, well nourished, no acute distress Skin:  Warm and dry Neck:  Midline trachea, normal thyroid, good ROM, no lymphadenopathy Lungs; Clear to auscultation bilaterally Breast:  No dominant palpable mass, retraction, or nipple discharge Cardiovascular: Regular rate and rhythm Abdomen:  Soft, non tender, no hepatosplenomegaly Pelvic:  External genitalia is normal in appearance, boil left labia is smaller and still draining some. The vagina is normal in appearance. Urethra has no lesions or masses. The cervix is bulbous, pap with GC/CHL, and HR HPV genotyping performed.  Uterus is felt to be normal size, shape, and contour.  No adnexal masses or tenderness noted.Bladder is non tender, no masses felt. Extremities/musculoskeletal:  No swelling or varicosities noted, no clubbing or cyanosis Psych:  No mood changes, alert and cooperative,seems happy AA is 2 Fall risk is low    02/04/2023    9:59 AM 08/09/2022   10:31 AM 03/02/2022    9:23 AM  Depression screen PHQ 2/9   Decreased Interest 1 2 1   Down, Depressed, Hopeless 1 2 1   PHQ - 2 Score 2 4 2   Altered sleeping 1 2 2   Tired, decreased energy 1 2 2   Change in appetite 1 1 0  Feeling bad or failure about yourself  1 2 1   Trouble concentrating 2 2 2   Moving slowly or fidgety/restless 0 1 0  Suicidal thoughts 0 0 0  PHQ-9 Score 8 14 9   Difficult doing work/chores  Somewhat difficult Somewhat difficult       02/04/2023   10:00 AM 08/09/2022   10:32 AM 03/02/2022    9:23 AM 11/19/2020   10:36 AM  GAD 7 : Generalized Anxiety Score  Nervous, Anxious, on Edge 2 2 3 1   Control/stop worrying 2 2 2 1   Worry too much - different things 2 2 2 1   Trouble relaxing 2 2 2 1   Restless 0 1 2 1   Easily annoyed or irritable 2 2 2 1   Afraid - awful might happen 0 2 0 0  Total GAD 7 Score 10 13 13 6   Anxiety Difficulty  Somewhat difficult Somewhat difficult     Upstream - 02/04/23 1010       Pregnancy Intention Screening   Does the patient want to become pregnant in the next year? No    Would the patient like to discuss contraceptive options today? Yes      Contraception Wrap Up   Current Method Withdrawal or Other Method    End Method Vaginal  Ring    Contraception Counseling Provided Yes    How was the end contraceptive method provided? Prescription            Examination chaperoned by Freddie Apley RN     Impression and Plan: 1. Encounter for routine gynecological examination with Papanicolaou smear of cervix (Primary) Pap sent Pap in 3 years if normal Physical in 1 year - Cytology - PAP( )  2. Vulval boil Continue warm compress and silvadene cream  Will rx doxycycline  3. Encounter for initial prescription of vaginal ring hormonal contraceptive Denies MI,stroke, DVT breast cancer or migraine with aura Has used nuva ring in the past and wants that Will rx nuva ring Meds ordered this encounter  Medications   etonogestrel-ethinyl estradiol (NUVARING) 0.12-0.015 MG/24HR  vaginal ring    Sig: Insert vaginally and leave in place for 3 consecutive weeks, then remove for 1 week.    Dispense:  1 each    Refill:  12    Supervising Provider:   Duane Lope H [[id number]]   doxycycline (VIBRA-TABS) 100 MG tablet    Sig: Take 1 tablet (100 mg total) by mouth 2 (two) times daily.    Dispense:  20 tablet    Refill:  0    Supervising Provider:   Duane Lope H [[id number]]   Follow up in 3 months for ROS   4. Anxiety and depression Declines meds

## 2023-02-07 LAB — CYTOLOGY - PAP
Adequacy: ABSENT
Chlamydia: NEGATIVE
Comment: NEGATIVE
Comment: NEGATIVE
Comment: NORMAL
Diagnosis: NEGATIVE
High risk HPV: NEGATIVE
Neisseria Gonorrhea: NEGATIVE

## 2023-03-07 ENCOUNTER — Other Ambulatory Visit: Payer: Self-pay | Admitting: Adult Health

## 2023-03-07 MED ORDER — NYSTATIN 100000 UNIT/ML MT SUSP: 5.0000 mL | Freq: Four times a day (QID) | OROMUCOSAL | 0 refills | Status: DC

## 2023-03-07 NOTE — Progress Notes (Signed)
Will rx nystatin susp

## 2023-05-06 ENCOUNTER — Encounter: Payer: Self-pay | Admitting: Adult Health

## 2023-05-06 ENCOUNTER — Ambulatory Visit: Payer: Commercial Managed Care - PPO | Admitting: Adult Health

## 2023-05-06 VITALS — BP 130/89 | HR 68 | Ht 62.0 in | Wt 166.5 lb

## 2023-05-06 DIAGNOSIS — F419 Anxiety disorder, unspecified: Secondary | ICD-10-CM

## 2023-05-06 DIAGNOSIS — R030 Elevated blood-pressure reading, without diagnosis of hypertension: Secondary | ICD-10-CM | POA: Diagnosis not present

## 2023-05-06 DIAGNOSIS — F32A Depression, unspecified: Secondary | ICD-10-CM

## 2023-05-06 DIAGNOSIS — Z1331 Encounter for screening for depression: Secondary | ICD-10-CM | POA: Diagnosis not present

## 2023-05-06 NOTE — Progress Notes (Signed)
 Subjective:     Patient ID: Wanda Young, female   DOB: March 07, 1985, 38 y.o.   MRN: [id number]  HPI Tiffnay is a 38 year old black female,single,G4P2022, back in follow up on starting nuva ring in December and on anxiety and depression. She stopped the nuvra ring after 1.5 months due to elevated BP and elevated HR , HR could be 140, just laying on the couch. She says she is not really depressed just overwhelmed. She works nights and is going to school too, but that ends May 13.       Component Value Date/Time   DIAGPAP  02/04/2023 1122    - Negative for intraepithelial lesion or malignancy (NILM)   DIAGPAP  05/02/2019 1453    - Negative for intraepithelial lesion or malignancy (NILM)   HPVHIGH Negative 02/04/2023 1122   HPVHIGH Negative 05/02/2019 1453   ADEQPAP  02/04/2023 1122    Satisfactory for evaluation; transformation zone component ABSENT.   ADEQPAP  05/02/2019 1453    Satisfactory for evaluation; transformation zone component PRESENT.   Dr Adriana Simas  Review of Systems Had elevated BP and HR so stopped nuva ring Feels overwhelmed right now, not depressed Reviewed past medical,surgical, social and family history. Reviewed medications and allergies.     Objective:   Physical Exam BP 130/89 (BP Location: Left Arm, Patient Position: Sitting, Cuff Size: Normal)   Pulse 68   Ht 5\' 2"  (1.575 m)   Wt 166 lb 8 oz (75.5 kg)   LMP 05/03/2023   BMI 30.45 kg/m     Skin warm and dry. Lungs: clear to ausculation bilaterally. Cardiovascular: regular rate and rhythm.  Fall risk is low    05/06/2023    8:47 AM 02/04/2023    9:59 AM 08/09/2022   10:31 AM  Depression screen PHQ 2/9  Decreased Interest 1 1 2   Down, Depressed, Hopeless 1 1 2   PHQ - 2 Score 2 2 4   Altered sleeping 2 1 2   Tired, decreased energy 2 1 2   Change in appetite 1 1 1   Feeling bad or failure about yourself  1 1 2   Trouble concentrating 1 2 2   Moving slowly or fidgety/restless 0 0 1  Suicidal thoughts 0 0 0   PHQ-9 Score 9 8 14   Difficult doing work/chores Somewhat difficult  Somewhat difficult       05/06/2023    8:48 AM 02/04/2023   10:00 AM 08/09/2022   10:32 AM 03/02/2022    9:23 AM  GAD 7 : Generalized Anxiety Score  Nervous, Anxious, on Edge 2 2 2 3   Control/stop worrying 2 2 2 2   Worry too much - different things 2 2 2 2   Trouble relaxing 2 2 2 2   Restless 1 0 1 2  Easily annoyed or irritable 2 2 2 2   Afraid - awful might happen 0 0 2 0  Total GAD 7 Score 11 10 13 13   Anxiety Difficulty   Somewhat difficult Somewhat difficult      Upstream - 05/06/23 4098       Pregnancy Intention Screening   Does the patient want to become pregnant in the next year? No    Does the patient's partner want to become pregnant in the next year? No    Would the patient like to discuss contraceptive options today? No      Contraception Wrap Up   Current Method Abstinence    End Method Abstinence    Contraception Counseling Provided No  Assessment:     1. Elevated BP without diagnosis of hypertension (Primary) Keep check on BP and HR Has appt with Dr Adriana Simas soon and wants labs then  2. Anxiety and depression Feels overwhelmed not depressed No SI or HI, declines meds for now, is taking magnesium, did mention ashwagandha  to her    Plan:     Follow up prn

## 2023-05-10 ENCOUNTER — Ambulatory Visit: Admitting: Family Medicine

## 2023-05-10 VITALS — BP 126/88 | HR 69 | Temp 98.2°F | Ht 62.0 in | Wt 165.0 lb

## 2023-05-10 DIAGNOSIS — E559 Vitamin D deficiency, unspecified: Secondary | ICD-10-CM | POA: Diagnosis not present

## 2023-05-10 DIAGNOSIS — R002 Palpitations: Secondary | ICD-10-CM | POA: Insufficient documentation

## 2023-05-10 DIAGNOSIS — Z130 Encounter for screening for diseases of the blood and blood-forming organs and certain disorders involving the immune mechanism: Secondary | ICD-10-CM | POA: Diagnosis not present

## 2023-05-10 DIAGNOSIS — R7309 Other abnormal glucose: Secondary | ICD-10-CM | POA: Diagnosis not present

## 2023-05-10 DIAGNOSIS — Z13220 Encounter for screening for lipoid disorders: Secondary | ICD-10-CM

## 2023-05-10 DIAGNOSIS — E538 Deficiency of other specified B group vitamins: Secondary | ICD-10-CM

## 2023-05-10 NOTE — Progress Notes (Signed)
 Subjective:  Patient ID: Wanda Young, female    DOB: 1985-03-20  Age: 38 y.o. MRN: [id number]  CC:  Palpitations   HPI:  38 year old female presents for evaluation of the above.  Patient reports that she has had intermittent palpitations.  Recently had an episode where her heart rate stayed elevated in the 140s for several hours.  She reports some associated shortness of breath.  She states that this was quite severe and worrisome for her.  Patient wants to be evaluated and wants to have labs done.  Patient drinks 1 cup of coffee a day if that.  No other stimulants.  No supplements other than what is listed in her chart.  No other complaints or concerns at this time.  Of note, PHQ-9 score of 10 and GAD-7 score of 11.  She reports stressors.  Patient Active Problem List   Diagnosis Date Noted   Palpitations 05/10/2023   Elevated BP without diagnosis of hypertension 05/06/2023   Anxiety and depression 02/04/2023    Social Hx   Social History   Socioeconomic History   Marital status: Single    Spouse name: Not on file   Number of children: 2   Years of education: Not on file   Highest education level: Not on file  Occupational History   Not on file  Tobacco Use   Smoking status: Never   Smokeless tobacco: Never  Vaping Use   Vaping status: Never Used  Substance and Sexual Activity   Alcohol use: Yes    Comment: 2-3 glasses a month   Drug use: Never   Sexual activity: Not Currently    Birth control/protection: Condom, Abstinence  Other Topics Concern   Not on file  Social History Narrative   Not on file   Social Drivers of Health   Financial Resource Strain: Medium Risk (02/04/2023)   Overall Financial Resource Strain (CARDIA)    Difficulty of Paying Living Expenses: Somewhat hard  Food Insecurity: No Food Insecurity (02/04/2023)   Hunger Vital Sign    Worried About Running Out of Food in the Last Year: Never true    Ran Out of Food in the Last Year: Never  true  Transportation Needs: No Transportation Needs (02/04/2023)   PRAPARE - Administrator, Civil Service (Medical): No    Lack of Transportation (Non-Medical): No  Physical Activity: Insufficiently Active (02/04/2023)   Exercise Vital Sign    Days of Exercise per Week: 1 day    Minutes of Exercise per Session: 20 min  Stress: Stress Concern Present (02/04/2023)   [employer] of Occupational Health - Occupational Stress Questionnaire    Feeling of Stress : To some extent  Social Connections: Moderately Isolated (02/04/2023)   Social Connection and Isolation Panel [NHANES]    Frequency of Communication with Friends and Family: More than three times a week    Frequency of Social Gatherings with Friends and Family: Once a week    Attends Religious Services: Never    Database administrator or Organizations: No    Attends Engineer, structural: Never    Marital Status: Living with partner    Review of Systems Per HPI  Objective:  BP 126/88   Pulse 69   Temp 98.2 F (36.8 C)   Ht 5\' 2"  (1.575 m)   Wt 165 lb (74.8 kg)   LMP 05/03/2023   SpO2 100%   BMI 30.18 kg/m      05/10/2023  8:33 AM 05/06/2023    9:01 AM 05/06/2023    8:46 AM  BP/Weight  Systolic BP 126 130 136  Diastolic BP 88 89 91  Wt. (Lbs) 165  166.5  BMI 30.18 kg/m2  30.45 kg/m2    Physical Exam Vitals and nursing note reviewed.  Constitutional:      General: She is not in acute distress.    Appearance: Normal appearance.  HENT:     Head: Normocephalic and atraumatic.  Eyes:     General:        Right eye: No discharge.        Left eye: No discharge.     Conjunctiva/sclera: Conjunctivae normal.  Cardiovascular:     Rate and Rhythm: Normal rate and regular rhythm.  Pulmonary:     Effort: Pulmonary effort is normal.     Breath sounds: Normal breath sounds. No wheezing, rhonchi or rales.  Neurological:     Mental Status: She is alert.  Psychiatric:        Mood and Affect:  Mood normal.        Behavior: Behavior normal.     Lab Results  Component Value Date   WBC 6.0 03/25/2018   HGB 13.8 03/25/2018   HCT 44.2 03/25/2018   PLT 216 03/25/2018   GLUCOSE 104 (H) 03/25/2018   ALT 15 03/25/2018   AST 14 (L) 03/25/2018   NA 137 03/25/2018   K 3.9 03/25/2018   CL 106 03/25/2018   CREATININE 0.85 03/25/2018   BUN 11 03/25/2018   CO2 25 03/25/2018     Assessment & Plan:  Palpitations Assessment & Plan: Patient's cardiac exam normal today.  Patient in sinus rhythm.  Normal rate.  Arranging for cardiology referral for Zio patch. Obtaining further workup today with laboratory studies.  Orders: -     CMP14+EGFR -     TSH + free T4 -     T3, free -     Magnesium -     Ambulatory referral to Cardiology  Screening for deficiency anemia -     CBC -     Lipid panel  Elevated glucose -     Hemoglobin A1c  Screening for lipid disorders  Vitamin B12 deficiency -     Vitamin B12  Vitamin D deficiency -     VITAMIN D 25 Hydroxy (Vit-D Deficiency, Fractures)    Follow-up:  Return in about 6 months (around 11/10/2023).  Everlene Other DO [facility]

## 2023-05-10 NOTE — Assessment & Plan Note (Signed)
 Patient's cardiac exam normal today.  Patient in sinus rhythm.  Normal rate.  Arranging for cardiology referral for Zio patch. Obtaining further workup today with laboratory studies.

## 2023-05-10 NOTE — Patient Instructions (Signed)
 Labs today.  Referring to cardiology.  We will call with results.  Follow up in 6 months.

## 2023-05-11 ENCOUNTER — Encounter: Payer: Self-pay | Admitting: Family Medicine

## 2023-05-12 LAB — CBC
Hematocrit: 42.5 % (ref 34.0–46.6)
Hemoglobin: 14 g/dL (ref 11.1–15.9)
MCH: 26.7 pg (ref 26.6–33.0)
MCHC: 32.9 g/dL (ref 31.5–35.7)
MCV: 81 fL (ref 79–97)
Platelets: 273 10*3/uL (ref 150–450)
RBC: 5.24 x10E6/uL (ref 3.77–5.28)
RDW: 13.1 % (ref 11.7–15.4)
WBC: 7.7 10*3/uL (ref 3.4–10.8)

## 2023-05-12 LAB — CMP14+EGFR
ALT: 24 IU/L (ref 0–32)
AST: 31 IU/L (ref 0–40)
Albumin: 4.6 g/dL (ref 3.9–4.9)
Alkaline Phosphatase: 72 IU/L (ref 44–121)
BUN/Creatinine Ratio: 11 (ref 9–23)
BUN: 11 mg/dL (ref 6–20)
Bilirubin Total: 0.5 mg/dL (ref 0.0–1.2)
CO2: 23 mmol/L (ref 20–29)
Calcium: 9.9 mg/dL (ref 8.7–10.2)
Chloride: 103 mmol/L (ref 96–106)
Creatinine, Ser: 0.96 mg/dL (ref 0.57–1.00)
Globulin, Total: 2.8 g/dL (ref 1.5–4.5)
Glucose: 87 mg/dL (ref 70–99)
Potassium: 4.6 mmol/L (ref 3.5–5.2)
Sodium: 138 mmol/L (ref 134–144)
Total Protein: 7.4 g/dL (ref 6.0–8.5)
eGFR: 79 mL/min/{1.73_m2} (ref >59)

## 2023-05-12 LAB — LIPID PANEL
Chol/HDL Ratio: 2.2 ratio (ref 0.0–4.4)
Cholesterol, Total: 177 mg/dL (ref 100–199)
HDL: 81 mg/dL (ref >39)
LDL Chol Calc (NIH): 82 mg/dL (ref 0–99)
Triglycerides: 74 mg/dL (ref 0–149)
VLDL Cholesterol Cal: 14 mg/dL (ref 5–40)

## 2023-05-12 LAB — VITAMIN D 25 HYDROXY (VIT D DEFICIENCY, FRACTURES): Vit D, 25-Hydroxy: 35.1 ng/mL (ref 30.0–100.0)

## 2023-05-12 LAB — VITAMIN B12: Vitamin B-12: 1991 pg/mL — ABNORMAL HIGH (ref 232–1245)

## 2023-05-12 LAB — MAGNESIUM: Magnesium: 2.1 mg/dL (ref 1.6–2.3)

## 2023-05-12 LAB — HEMOGLOBIN A1C
Est. average glucose Bld gHb Est-mCnc: 105 mg/dL
Hgb A1c MFr Bld: 5.3 % (ref 4.8–5.6)

## 2023-05-12 LAB — T3, FREE: T3, Free: 3.4 pg/mL (ref 2.0–4.4)

## 2023-05-12 LAB — TSH+FREE T4
Free T4: 1.28 ng/dL (ref 0.82–1.77)
TSH: 1.14 u[IU]/mL (ref 0.450–4.500)

## 2023-08-03 ENCOUNTER — Ambulatory Visit: Attending: Cardiovascular Disease | Admitting: Cardiovascular Disease

## 2023-08-03 ENCOUNTER — Encounter: Payer: Self-pay | Admitting: Cardiovascular Disease

## 2023-08-03 VITALS — BP 118/62 | HR 56 | Ht 62.0 in | Wt 162.2 lb

## 2023-08-03 DIAGNOSIS — R002 Palpitations: Secondary | ICD-10-CM

## 2023-08-03 NOTE — Progress Notes (Signed)
 08/03/2023 Wanda Young   [birth year]  [id number]  Primary Physician Cook, Jayce G, DO Primary Cardiologist: Avanell Leigh MD [facility]  HPI:  Wanda Young is a 38 y.o. 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  She was referred by her PCP, Rodell Citrin DO, who referred her here for palpitations.  This began after she contracted COVID in 2020 and again in 2021.  She did receive vaccines but no boosters.  The palpitations occurred several times a week lasting up to hours at a time.  They became more noticeable and concerning several months ago.  She does drink caffeine beverages including coffee on a daily basis.  Her TSH is normal.  She started driving several times a week recently and has been asymptomatic.   Current Meds  Medication Sig   cholecalciferol (VITAMIN D3) 25 MCG (1000 UNIT) tablet Take 1,000 Units by mouth daily.   MAGNESIUM GLYCINATE PO Take by mouth.   Multiple Vitamin (MULTIVITAMIN) capsule Take 1 capsule by mouth daily.     No Known Allergies  Social History   Socioeconomic History   Marital status: Single    Spouse name: Not on file   Number of children: 2   Years of education: Not on file   Highest education level: Not on file  Occupational History   Not on file  Tobacco Use   Smoking status: Never   Smokeless tobacco: Never  Vaping Use   Vaping status: Never Used  Substance and Sexual Activity   Alcohol use: Yes    Comment: 2-3 glasses a month   Drug use: Never   Sexual activity: Not Currently    Birth control/protection: Condom, Abstinence  Other Topics Concern   Not on file  Social History Narrative   Not on file   Social Drivers of Health   Financial Resource Strain: Medium Risk (02/04/2023)   Overall Financial Resource Strain (CARDIA)    Difficulty of Paying Living Expenses: Somewhat hard  Food Insecurity: No Food Insecurity  (02/04/2023)   Hunger Vital Sign    Worried About Running Out of Food in the Last Year: Never true    Ran Out of Food in the Last Year: Never true  Transportation Needs: No Transportation Needs (02/04/2023)   PRAPARE - Administrator, Civil Service (Medical): No    Lack of Transportation (Non-Medical): No  Physical Activity: Insufficiently Active (02/04/2023)   Exercise Vital Sign    Days of Exercise per Week: 1 day    Minutes of Exercise per Session: 20 min  Stress: Stress Concern Present (02/04/2023)   [employer] of Occupational Health - Occupational Stress Questionnaire    Feeling of Stress : To some extent  Social Connections: Moderately Isolated (02/04/2023)   Social Connection and Isolation Panel [NHANES]    Frequency of Communication with Friends and Family: More than three times a week    Frequency of Social Gatherings with Friends and Family: Once a week    Attends Religious Services: Never    Database administrator or Organizations: No    Attends Banker Meetings: Never    Marital Status: Living with partner  Intimate Partner Violence: Not At Risk (02/04/2023)   Humiliation, Afraid, Rape, and Kick questionnaire    Fear of Current or Ex-Partner: No    Emotionally Abused: No    Physically Abused: No  Sexually Abused: No     Review of Systems: General: negative for chills, fever, night sweats or weight changes.  Cardiovascular: negative for chest pain, dyspnea on exertion, edema, orthopnea, palpitations, paroxysmal nocturnal dyspnea or shortness of breath Dermatological: negative for rash Respiratory: negative for cough or wheezing Urologic: negative for hematuria Abdominal: negative for nausea, vomiting, diarrhea, bright red blood per rectum, melena, or hematemesis Neurologic: negative for visual changes, syncope, or dizziness All other systems reviewed and are otherwise negative except as noted above.    Blood pressure 118/62, pulse  (!) 56, height 5' 2 (1.575 m), weight 162 lb 3.2 oz (73.6 kg), SpO2 99%.  General appearance: alert and no distress Neck: no adenopathy, no carotid bruit, no JVD, supple, symmetrical, trachea midline, and thyroid  not enlarged, symmetric, no tenderness/mass/nodules Lungs: clear to auscultation bilaterally Heart: regular rate and rhythm, S1, S2 normal, no murmur, click, rub or gallop Extremities: extremities normal, atraumatic, no cyanosis or edema Pulses: 2+ and symmetric Skin: Skin color, texture, turgor normal. No rashes or lesions Neurologic: Grossly normal  EKG EKG Interpretation Date/Time:  Wednesday August 03 2023 09:18:30 EDT Ventricular Rate:  56 PR Interval:  152 QRS Duration:  82 QT Interval:  402 QTC Calculation: 387 R Axis:   89  Text Interpretation: Sinus bradycardia No previous ECGs available Confirmed by Lauro Portal [phone]) on 08/03/2023 9:28:40 AM    ASSESSMENT AND PLAN:   Palpitations [name] was referred to me by Dr. Debrah Fan for evaluation of palpitations.  She says that these began after she developed COVID in 2020 and 21.  She did receive the vaccine.  They were occurring 1-2 times a week lasting for hours at a time.  More recently she has had a prolonged episode which has been concerning to her.  She does admit to anxiety.  She has had some increasing shortness of breath.  She just began an exercise routine jog several times a week.  She drinks 5 cups of coffee a week and several soft drinks.  Her TSH is normal.  I am going to get a 2D echo and a 30-day event monitor to further evaluate.     Avanell Leigh MD FACP,FACC,FAHA, [facility] 08/03/2023 9:39 AM

## 2023-08-03 NOTE — Assessment & Plan Note (Signed)
 Wanda Young was referred to me by Dr. Debrah Fan for evaluation of palpitations.  She says that these began after she developed COVID in 2020 and 21.  She did receive the vaccine.  They were occurring 1-2 times a week lasting for hours at a time.  More recently she has had a prolonged episode which has been concerning to her.  She does admit to anxiety.  She has had some increasing shortness of breath.  She just began an exercise routine jog several times a week.  She drinks 5 cups of coffee a week and several soft drinks.  Her TSH is normal.  I am going to get a 2D echo and a 30-day event monitor to further evaluate.

## 2023-08-03 NOTE — Patient Instructions (Signed)
 Medication Instructions:  Your physician recommends that you continue on your current medications as directed. Please refer to the Current Medication list given to you today.  *If you need a refill on your cardiac medications before your next appointment, please call your pharmacy*   Testing/Procedures: Your physician has requested that you have an echocardiogram. Echocardiography is a painless test that uses sound waves to create images of your heart. It provides your doctor with information about the size and shape of your heart and how well your heart's chambers and valves are working. This procedure takes approximately one hour. There are no restrictions for this procedure. Please do NOT wear cologne, perfume, aftershave, or lotions (deodorant is allowed). Please arrive 15 minutes prior to your appointment time.  Please note: We ask at that you not bring children with you during ultrasound (echo/ vascular) testing. Due to room size and safety concerns, children are not allowed in the ultrasound rooms during exams. Our front office staff cannot provide observation of children in our lobby area while testing is being conducted. An adult accompanying a patient to their appointment will only be allowed in the ultrasound room at the discretion of the ultrasound technician under special circumstances. We apologize for any inconvenience.   [employer] Cardiac Event Monitor Instructions  Your physician has requested you wear your cardiac event monitor for 30 days. [employer] may call or text to confirm a shipping address. The monitor will be sent to a land address via UPS. [employer] will not ship a monitor to a PO BOX. It typically takes 3-5 days to receive your monitor after it has been enrolled. [employer] will assist with USPS tracking if your package is delayed. The telephone number for [employer] is [phone]. Once you have received your monitor, please review the enclosed instructions.  Instruction tutorials can also be viewed under help and settings on the enclosed cell phone. Your monitor has already been registered assigning a specific monitor serial # to you.  Billing and Self Pay Discount Information  [employer] has been provided the insurance information we had on file for you.  If your insurance has been updated, please call [employer] at [phone] to provide them with your updated insurance information.   [employer] offers a discounted Self Pay option for patients who have insurance that does not cover their cardiac event monitor or patients without insurance.  The discounted cost of a Self Pay Cardiac Event Monitor would be $225.00 , if the patient contacts [employer] at [phone] within 7 days of applying the monitor to make payment arrangements.  If the patient does not contact [employer] within 7 days of applying the monitor, the cost of the cardiac event monitor will be $350.00.  Applying the monitor  Remove cell phone from case and turn it on. The cell phone works as IT consultant and needs to be within [employer] of you at all times. The cell phone will need to be charged on a daily basis. We recommend you plug the cell phone into the enclosed charger at your bedside table every night.  Monitor batteries: You will receive two monitor batteries labelled #1 and #2. These are your recorders. Plug battery #2 onto the second connection on the enclosed charger. Keep one battery on the charger at all times. This will keep the monitor battery deactivated. It will also keep it fully charged for when you need to switch your monitor batteries. A small light will be blinking on the battery emblem when it is charging. The  light on the battery emblem will remain on when the battery is fully charged.  Open package of a Monitor strip. Insert battery #1 into black hood on strip and gently squeeze monitor battery onto connection as indicated in instruction booklet. Set  aside while preparing skin.  Choose location for your strip, vertical or horizontal, as indicated in the instruction booklet. Shave to remove all hair from location. There cannot be any lotions, oils, powders, or colognes on skin where monitor is to be applied. Wipe skin clean with enclosed Saline wipe. Dry skin completely.  Peel paper labeled #1 off the back of the Monitor strip exposing the adhesive. Place the monitor on the chest in the vertical or horizontal position shown in the instruction booklet. One arrow on the monitor strip must be pointing upward. Carefully remove paper labeled #2, attaching remainder of strip to your skin. Try not to create any folds or wrinkles in the strip as you apply it.  Firmly press and release the circle in the center of the monitor battery. You will hear a small beep. This is turning the monitor battery on. The heart emblem on the monitor battery will light up every 5 seconds if the monitor battery in turned on and connected to the patient securely. Do not push and hold the circle down as this turns the monitor battery off. The cell phone will locate the monitor battery. A screen will appear on the cell phone checking the connection of your monitor strip. This may read poor connection initially but change to good connection within the next minute. Once your monitor accepts the connection you will hear a series of 3 beeps followed by a climbing crescendo of beeps. A screen will appear on the cell phone showing the two monitor strip placement options. Touch the picture that demonstrates where you applied the monitor strip.  Your monitor strip and battery are waterproof. You are able to shower, bathe, or swim with the monitor on. They just ask you do not submerge deeper than 3 feet underwater. We recommend removing the monitor if you are swimming in a lake, river, or ocean.  Your monitor battery will need to be switched to a fully charged monitor battery  approximately once a week. The cell phone will alert you of an action which needs to be made.  On the cell phone, tap for details to reveal connection status, monitor battery status, and cell phone battery status. The green dots indicates your monitor is in good status. A red dot indicates there is something that needs your attention.  To record a symptom, click the circle on the monitor battery. In 30-60 seconds a list of symptoms will appear on the cell phone. Select your symptom and tap save. Your monitor will record a sustained or significant arrhythmia regardless of you clicking the button. Some patients do not feel the heart rhythm irregularities. [employer] will notify us  of any serious or critical events.  Refer to instruction booklet for instructions on switching batteries, changing strips, the Do not disturb or Pause features, or any additional questions.  Call [employer] at [phone], to confirm your monitor is transmitting and record your baseline. They will answer any questions you may have regarding the monitor instructions at that time.  Returning the monitor to [employer]  Place all equipment back into blue box. Peel off strip of paper to expose adhesive and close box securely. There is a prepaid UPS shipping label on this box. Drop in a UPS drop box,  or at a UPS facility like [employer]. You may also contact [employer] to arrange UPS to pick up monitor package at your home.    Follow-Up: At [facility], you and your health needs are our priority.  As part of our continuing mission to provide you with exceptional heart care, our providers are all part of one team.  This team includes your primary Cardiologist (physician) and Advanced Practice Providers or APPs (Physician Assistants and Nurse Practitioners) who all work together to provide you with the care you need, when you need it.  Your next appointment:   3 month(s)  Provider:   Lauro Portal,  MD   We recommend signing up for the patient portal called MyChart.  Sign up information is provided on this After Visit Summary.  MyChart is used to connect with patients for Virtual Visits (Telemedicine).  Patients are able to view lab/test results, encounter notes, upcoming appointments, etc.  Non-urgent messages can be sent to your provider as well.   To learn more about what you can do with MyChart, go to [email or web address].

## 2023-08-10 DIAGNOSIS — R002 Palpitations: Secondary | ICD-10-CM | POA: Diagnosis not present

## 2023-09-12 ENCOUNTER — Ambulatory Visit: Attending: Cardiovascular Disease

## 2023-09-12 DIAGNOSIS — R002 Palpitations: Secondary | ICD-10-CM

## 2023-09-13 ENCOUNTER — Ambulatory Visit (HOSPITAL_COMMUNITY)
Admission: RE | Admit: 2023-09-13 | Discharge: 2023-09-13 | Disposition: A | Discharge status: Home / Self Care | Source: Ambulatory Visit | Attending: Cardiology | Admitting: Cardiology

## 2023-09-13 ENCOUNTER — Ambulatory Visit: Payer: Self-pay | Admitting: Cardiovascular Disease

## 2023-09-13 DIAGNOSIS — R002 Palpitations: Secondary | ICD-10-CM | POA: Insufficient documentation

## 2023-09-13 LAB — ECHOCARDIOGRAM COMPLETE
Area-P 1/2: 5.25 cm2
S' Lateral: 2.7 cm

## 2023-09-18 DIAGNOSIS — R002 Palpitations: Secondary | ICD-10-CM | POA: Diagnosis not present

## 2023-10-27 DIAGNOSIS — O200 Threatened abortion: Secondary | ICD-10-CM

## 2023-10-28 DIAGNOSIS — O200 Threatened abortion: Secondary | ICD-10-CM | POA: Diagnosis not present

## 2023-10-29 LAB — BETA HCG QUANT (REF LAB): hCG Quant: 76 m[IU]/mL

## 2023-10-30 ENCOUNTER — Ambulatory Visit: Payer: Self-pay | Admitting: Adult Health

## 2023-10-30 DIAGNOSIS — O039 Complete or unspecified spontaneous abortion without complication: Secondary | ICD-10-CM

## 2023-10-30 DIAGNOSIS — Z3201 Encounter for pregnancy test, result positive: Secondary | ICD-10-CM

## 2023-10-31 DIAGNOSIS — Z3201 Encounter for pregnancy test, result positive: Secondary | ICD-10-CM | POA: Diagnosis not present

## 2023-11-01 LAB — BETA HCG QUANT (REF LAB): hCG Quant: 16 m[IU]/mL

## 2023-11-07 ENCOUNTER — Ambulatory Visit: Attending: Cardiovascular Disease | Admitting: Cardiovascular Disease

## 2023-11-07 DIAGNOSIS — O039 Complete or unspecified spontaneous abortion without complication: Secondary | ICD-10-CM | POA: Diagnosis not present

## 2023-11-08 LAB — BETA HCG QUANT (REF LAB): hCG Quant: 2 m[IU]/mL

## 2023-11-10 ENCOUNTER — Ambulatory Visit: Admitting: Family Medicine

## 2024-01-06 ENCOUNTER — Telehealth: Admitting: Physician Assistant

## 2024-01-06 ENCOUNTER — Other Ambulatory Visit (HOSPITAL_COMMUNITY): Payer: Self-pay

## 2024-01-06 DIAGNOSIS — B9689 Other specified bacterial agents as the cause of diseases classified elsewhere: Secondary | ICD-10-CM

## 2024-01-06 DIAGNOSIS — N760 Acute vaginitis: Secondary | ICD-10-CM | POA: Diagnosis not present

## 2024-01-06 MED ORDER — METRONIDAZOLE 500 MG PO TABS
500.0000 mg | ORAL_TABLET | Freq: Two times a day (BID) | ORAL | 0 refills | Status: AC
Start: 2024-01-06 — End: 2024-01-13
  Filled 2024-01-06: qty 14, 7d supply, fill #0

## 2024-01-06 NOTE — Progress Notes (Signed)

## 2024-01-17 ENCOUNTER — Encounter

## 2024-01-20 ENCOUNTER — Telehealth: Admitting: Family Medicine

## 2024-01-20 ENCOUNTER — Other Ambulatory Visit (HOSPITAL_COMMUNITY): Payer: Self-pay

## 2024-01-20 DIAGNOSIS — B370 Candidal stomatitis: Secondary | ICD-10-CM | POA: Diagnosis not present

## 2024-01-20 DIAGNOSIS — J020 Streptococcal pharyngitis: Secondary | ICD-10-CM | POA: Diagnosis not present

## 2024-01-20 MED ORDER — NYSTATIN 100000 UNIT/ML MT SUSP
5.0000 mL | Freq: Four times a day (QID) | OROMUCOSAL | 0 refills | Status: AC
  Filled 2024-01-20: qty 200, 10d supply, fill #0

## 2024-01-20 MED ORDER — AMOXICILLIN 875 MG PO TABS
875.0000 mg | ORAL_TABLET | Freq: Two times a day (BID) | ORAL | 0 refills | Status: DC
  Filled 2024-01-20: qty 20, 10d supply, fill #0

## 2024-01-20 NOTE — Addendum Note (Signed)
 Addended by: ALMEDA DEGREE on: 01/20/2024 11:31 AM   Modules accepted: Orders

## 2024-01-20 NOTE — Progress Notes (Signed)

## 2024-01-20 NOTE — Progress Notes (Signed)
 Pt is stating she has thrush and not a throat infection. I willa advise Pt not to pick up amoxicillin and I will send Nystatin  sus.

## 2024-01-20 NOTE — Progress Notes (Signed)
 Looks like Nystatin  already sent.

## 2024-01-21 ENCOUNTER — Other Ambulatory Visit (HOSPITAL_COMMUNITY): Payer: Self-pay

## 2024-01-23 ENCOUNTER — Ambulatory Visit: Admitting: Obstetrics & Gynecology

## 2024-01-23 ENCOUNTER — Encounter: Payer: Self-pay | Admitting: Obstetrics & Gynecology

## 2024-01-23 ENCOUNTER — Other Ambulatory Visit (HOSPITAL_COMMUNITY)
Admission: RE | Admit: 2024-01-23 | Discharge: 2024-01-23 | Disposition: A | Source: Ambulatory Visit | Attending: Obstetrics & Gynecology | Admitting: Obstetrics & Gynecology

## 2024-01-23 VITALS — BP 132/92 | HR 76 | Ht 62.0 in | Wt 166.0 lb

## 2024-01-23 DIAGNOSIS — N393 Stress incontinence (female) (male): Secondary | ICD-10-CM | POA: Diagnosis not present

## 2024-01-23 DIAGNOSIS — Z113 Encounter for screening for infections with a predominantly sexual mode of transmission: Secondary | ICD-10-CM | POA: Diagnosis not present

## 2024-01-23 DIAGNOSIS — R030 Elevated blood-pressure reading, without diagnosis of hypertension: Secondary | ICD-10-CM

## 2024-01-23 DIAGNOSIS — Z01419 Encounter for gynecological examination (general) (routine) without abnormal findings: Secondary | ICD-10-CM

## 2024-01-23 DIAGNOSIS — Z30015 Encounter for initial prescription of vaginal ring hormonal contraceptive: Secondary | ICD-10-CM

## 2024-01-23 NOTE — Progress Notes (Signed)
 WELL-WOMAN EXAMINATION Patient name: Wanda Young MRN [id number]  Date of birth: May 03, 1985 Chief Complaint:   Gynecologic Exam  History of Present Illness:   Wanda Young is a 38 y.o. [phone]  female being seen today for a routine well-woman exam and the following concerns:  Contraceptive management: Recent SAB- in Sept.  Does not desire a pregnancy and wishes to pursue permanent sterilization.  She initially was considering salpingectomy; however, they have looked into options and her partner plans to pursue vasectomy.  Until that time she wishes to restart on NuvaRing, which she has used in the past.  Menses are regular each month- typically last about 3-4 days.  Denies HMB or dysmenorrhea.  Notes h/o BV- no symptoms currently.  Denies vaginal discharge itching or irritation.  Denies pelvic or abdominal pain.  Patient was seen by cardiology due to tachycardia/heart flutters.  No abnormalities were noted but was advised to have hormonal workup   Stress incontinence: Recently she has noted this has occurred more frequently.  When she coughs or sneezes she will leak urine notes moderate leaking.  She does note fairly significant caffeine intake at least 1 cup/day if not more  Patient's last menstrual period was 12/26/2023.  The current method of family planning is none.    Last pap 01/2023.  Last mammogram: NA. Last colonoscopy: NA     01/23/2024    1:29 PM 05/10/2023    8:38 AM 05/06/2023    8:47 AM 02/04/2023    9:59 AM 08/09/2022   10:31 AM  Depression screen PHQ 2/9  Decreased Interest 1 1 1 1 2   Down, Depressed, Hopeless 1 2 1 1 2   PHQ - 2 Score 2 3 2 2 4   Altered sleeping 2 2 2 1 2   Tired, decreased energy 2 1 2 1 2   Change in appetite 1 1 1 1 1   Feeling bad or failure about yourself  0 1 1 1 2   Trouble concentrating 1 1 1 2 2   Moving slowly or fidgety/restless 0 1 0 0 1  Suicidal thoughts 0 0 0 0 0  PHQ-9 Score 8 10  9  8  14    Difficult doing work/chores   Somewhat difficult Somewhat difficult  Somewhat difficult     Data saved with a previous flowsheet row definition      Review of Systems:   Pertinent items are noted in HPI Denies any headaches, blurred vision, fatigue, shortness of breath, chest pain, abdominal pain, bowel movements, urination, or intercourse unless otherwise stated above.  Pertinent History Reviewed:  Reviewed past medical,surgical, social and family history.  Reviewed problem list, medications and allergies. Physical Assessment:   Vitals:   01/23/24 1330 01/23/24 1355  BP: (!) 133/92 (!) 132/92  Pulse: 84 76  Weight: 166 lb (75.3 kg)   Height: 5' 2 (1.575 m)   Body mass index is 30.36 kg/m.        Physical Examination:   General appearance - well appearing, and in no distress  Mental status - alert, oriented to person, place, and time  Psych:  She has a normal mood and affect  Skin - warm and dry, normal color, no suspicious lesions noted  Chest - effort normal, all lung fields clear to auscultation bilaterally  Heart - normal rate and regular rhythm  Neck:  midline trachea, no thyromegaly or nodules  Breasts - breasts appear normal, no suspicious masses, no skin or nipple changes or  axillary nodes  Abdomen -  soft, nontender, nondistended, no masses or organomegaly  Pelvic - VULVA: normal appearing vulva with no masses, tenderness or lesions  VAGINA: normal appearing vagina with normal color and discharge, no lesions  CERVIX: normal appearing cervix without discharge or lesions, no CMT  UTERUS: uterus is felt to be normal size, shape, consistency and nontender   ADNEXA: No adnexal masses or tenderness noted.  Extremities:  No swelling or varicosities noted  Chaperone: Alan Fischer     Assessment & Plan:  1) Well-Woman Exam - Pap up-to-date - STI screening to be completed  2) Contraceptive management - Plan to restart NuvaRing until partner completes vasectomy  3) elevated BP - Suspect  increased due to current URI - Will plan to repeat in 3 months since starting on combined contraceptive management option   4) Stress incontinence - Reviewed conservative management options including cutting back on caffeine - Discussed Kegel/pelvic floor exercises to do on her own - Will also send in referral for pelvic floor therapy - In the future may consider referral to urogynecology  Orders Placed This Encounter  Procedures   HIV Antibody (routine testing w rflx)   RPR W/RFLX TO RPR TITER, TREPONEMAL AB, SCREEN AND DIAGNOSIS   Ambulatory referral to Physical Therapy    Meds: No orders of the defined types were placed in this encounter.   Follow-up: Return in about 1 year (around 01/22/2025) for Annual.   Shenequa Howse, DO Attending Obstetrician & Gynecologist, [facility], [facility]

## 2024-01-23 NOTE — Addendum Note (Signed)
 Addended by: BERNARDO ALAN CROME on: 01/23/2024 02:19 PM   Modules accepted: Orders

## 2024-01-24 ENCOUNTER — Ambulatory Visit: Payer: Self-pay | Admitting: Obstetrics & Gynecology

## 2024-01-24 LAB — HIV ANTIBODY (ROUTINE TESTING W REFLEX): HIV Screen 4th Generation wRfx: NONREACTIVE

## 2024-01-24 LAB — SYPHILIS: RPR W/REFLEX TO RPR TITER AND TREPONEMAL ANTIBODIES, TRADITIONAL SCREENING AND DIAGNOSIS ALGORITHM: RPR Ser Ql: NONREACTIVE

## 2024-01-25 LAB — CERVICOVAGINAL ANCILLARY ONLY
Chlamydia: NEGATIVE
Comment: NEGATIVE
Comment: NEGATIVE
Comment: NORMAL
Neisseria Gonorrhea: NEGATIVE
Trichomonas: NEGATIVE

## 2024-02-21 ENCOUNTER — Ambulatory Visit: Admitting: Family Medicine

## 2024-03-21 ENCOUNTER — Ambulatory Visit: Admitting: Adult Health

## 2024-03-22 ENCOUNTER — Encounter: Payer: Self-pay | Admitting: Physical Therapy

## 2024-03-22 ENCOUNTER — Ambulatory Visit: Attending: Obstetrics & Gynecology | Admitting: Physical Therapy

## 2024-03-22 ENCOUNTER — Other Ambulatory Visit: Payer: Self-pay

## 2024-03-22 DIAGNOSIS — R293 Abnormal posture: Secondary | ICD-10-CM | POA: Insufficient documentation

## 2024-03-22 DIAGNOSIS — M6281 Muscle weakness (generalized): Secondary | ICD-10-CM | POA: Insufficient documentation

## 2024-03-22 DIAGNOSIS — R279 Unspecified lack of coordination: Secondary | ICD-10-CM | POA: Diagnosis present

## 2024-03-22 DIAGNOSIS — N393 Stress incontinence (female) (male): Secondary | ICD-10-CM | POA: Diagnosis present

## 2024-03-22 NOTE — Therapy (Signed)
 " OUTPATIENT PHYSICAL THERAPY FEMALE PELVIC EVALUATION   Patient Name: Wanda Young MRN: [id number] DOB:1985-09-22, 39 y.o., female Today's Date: 03/22/2024  END OF SESSION:  PT End of Session - 03/22/24 1020     Visit Number 1    Number of Visits 8    Date for Recertification  09/19/24    Authorization Type [employer]    PT Start Time 0930    PT Stop Time 1015    PT Time Calculation (min) 45 min    Activity Tolerance Patient tolerated treatment well    Behavior During Therapy [facility] for tasks assessed/performed          History reviewed. No pertinent past medical history. History reviewed. No pertinent surgical history. Patient Active Problem List   Diagnosis Date Noted   Palpitations 05/10/2023   Elevated BP without diagnosis of hypertension 05/06/2023   Anxiety and depression 02/04/2023    PCP: Cook, Jayce G, DO  REFERRING PROVIDER: Ozan, Jennifer, DO  REFERRING DIAG: N39.3 (ICD-10-CM) - Stress incontinence  THERAPY DIAG:  Stress incontinence  Abnormal posture  Unspecified lack of coordination  Muscle weakness (generalized)  Rationale for Evaluation and Treatment: Rehabilitation  ONSET DATE: 1 year ago   SUBJECTIVE:                                                                                                                                                                                           SUBJECTIVE STATEMENT: She experiences leakage with sneezing and coughing. Leakage amount has increased over time. No leakage at night.  Fluid intake: can get up to two 40 oz bottles in per day;  1 cup of coffee; juice sometimes   FUNCTIONAL LIMITATIONS:   PERTINENT HISTORY:  Medications for current condition: none Surgeries: none  Other: none  Sexual abuse: No  PAIN:  Are you having pain? No NPRS scale: 0/10  PRECAUTIONS: None  RED FLAGS: None   WEIGHT BEARING RESTRICTIONS: No  FALLS:  Has patient fallen in last 6 months?  No  OCCUPATION: nurse and works night shifts   ACTIVITY LEVEL : 4 miles per day at least 5 days per week; walks a lot on shift   PLOF: Independent  PATIENT GOALS: to be able to control urinary jabits; to ont leak  BOWEL MOVEMENT: Pain with bowel movement: No Type of bowel movement:Type (Bristol Stool Scale) 3-4, Frequency every 2 days but this is better than it once was, Strain no, and Splinting no Fully empty rectum: No Leakage: No  Caused by: -- Bowel urgency: no Pads: No Fiber supplement/laxative Yes - has metamucil at home for when constipated   URINATION: Pain with urination: No Fully empty bladder: [place] to go frequently - especially when trying to sleep                                          Post-void dribble: No Stream: Strong Urgency: Yes  Frequency:during the day within normal limits usually                                                         Nocturia: Yes: once   Leakage: Coughing and Sneezing Pads/briefs: No  INTERCOURSE: no issues with intercourse   Ability to have vaginal penetration Yes  Pain with intercourse: none Dryness: Yes  Climax: yes with no pain  Marinoff Scale: 1/3 Lubricant: no - provided with water based samples   PREGNANCY: Vaginal deliveries 2 Tearing No Episiotomy Yes - stitches with this   PROLAPSE: None  OBJECTIVE:  Note: Objective measures were completed at Evaluation unless otherwise noted.  PATIENT SURVEYS:  PFIQ-7: 45  COGNITION: Overall cognitive status: Within functional limits for tasks assessed     SENSATION: Light touch: Appears intact  LUMBAR SPECIAL TESTS:  Straight leg raise test: Positive  FUNCTIONAL TESTS:  Single leg stance:  Rt: +   Lt: +  Sit-up test: 1/3  Squat: within normal limits  Bed mobility: within functional limits   GAIT: Assistive device utilized: None Comments: mild trendelenburg gait pattern with ambulation   POSTURE: rounded  shoulders and forward head  LUMBARAROM/PROM: within normal limits for all motions bilaterally with no pain   LOWER EXTREMITY ROM: within normal limits for all motions bilaterally with no pain   LOWER EXTREMITY MMT: 4/5 bilateral knees and hips grossly with no pain   PALPATION:  General: no tenderness to palpation of bilateral hip flexors or adductors   Pelvic Alignment: within normal limits; slight anterior pelvic tilt   Abdominal: abdominal bracing at rest  Diastasis: No Distortion: No  Breathing: apical  Scar tissue: No Active Straight Leg Raise:  + bilateral                 External Perineal Exam: normal moisture levels present, sufficient clitoral hood mobility present                              Internal Pelvic Floor: Patient fully consented to today's internal vaginal examination. She demonstrates normal tone in superficial and deep pelvic floor musculature bilaterally with no palpable trigger points. Her pelvic floor contraction is strong but limited in range of motion due to stiffness throughout. No significant vaginal wall laxity present with cough test in hooklying. Pt had no pain with today's examination.   Patient confirms identification and approves PT to assess internal pelvic floor and treatment Yes No emotional/communication barriers or cognitive limitation. Patient is motivated to learn. Patient understands and agrees with treatment goals and plan. PT explains patient will be examined in standing, sitting, and lying down to see how their muscles and joints work. When they are ready, they will be asked to remove their underwear  so PT can examine their perineum. The patient is also given the option of providing their own chaperone as one is not provided in our facility. The patient also has the right and is explained the right to defer or refuse any part of the evaluation or treatment including the internal exam. With the patient's consent, PT will use one gloved finger to  gently assess the muscles of the pelvic floor, seeing how well it contracts and relaxes and if there is muscle symmetry. After, the patient will get dressed and PT and patient will discuss exam findings and plan of care. PT and patient discuss plan of care, schedule, attendance policy and HEP activities. All internal or external pelvic floor assessments and/or treatments are completed with proper hand hygiene and gloves hands. If needed gloves are changed with hand hygiene during patient care time.  PELVIC MMT:   MMT eval  Vaginal 3/5, 5 quick flicks, 5 sec hold   Internal Anal Sphincter   External Anal Sphincter   Puborectalis   (Blank rows = not tested)       TONE: Normal in bilateral aspects of superficial and deep pelvic floor musculature   PROLAPSE: None present in hooklying   TODAY'S TREATMENT:                                                                                                                              DATE:   EVAL 03/22/24: Examination completed, findings reviewed, pt educated on POC, HEP, and self care. Pt motivated to participate in PT and agreeable to attempt recommendations.    Exercises - Supine Pelvic Floor Contraction  - 1 x daily - 7 x weekly - 3 sets - 10 reps - Quick Flick Pelvic Floor Contractions in Hooklying  - 1 x daily - 7 x weekly - 3 sets - 10 reps  Patient Education - Get To Know Your Pelvic Floor- Female - Urinary Incontinence - Lifestyle Changes that Support Urinary Health  PATIENT EDUCATION:  Education details: relative anatomy and the connection between the diaphragm and pelvic floor; lubricants vs vaginal moisturizers and samples provided Person educated: Patient Education method: Explanation, Demonstration, Tactile cues, Verbal cues, and Handouts Education comprehension: verbalized understanding, returned demonstration, verbal cues required, tactile cues required, and needs further education  HOME EXERCISE PROGRAM: Access Code:  [email or web address] URL: [email or web address] Date: 03/22/2024 Prepared by: Celena Domino  Exercises - Supine Pelvic Floor Contraction  - 1 x daily - 7 x weekly - 3 sets - 10 reps - Quick Flick Pelvic Floor Contractions in Hooklying  - 1 x daily - 7 x weekly - 3 sets - 10 reps  Patient Education - Get To Know Your Pelvic Floor- Female - Urinary Incontinence - Lifestyle Changes that Support Urinary Health  ASSESSMENT:  CLINICAL IMPRESSION: Patient is a 39 y.o. female  who was seen today for physical therapy evaluation and treatment for urinary incontinence. She has dealt with incontinence for ~1 year  and it has gotten worse over time. She leaks with coughing and sneezing, and leakage amount is enough to have to change her underwear. She does not have any bowel or intercourse-related concerns other that vaginal dryness, which pt was provided lubricant and vaginal moisturizer samples for today. She demonstrates mild lumbar stiffness with transferring and with ambulation. No pain to report today. Pt has had two vaginal deliveries, the first being traumatic in nature. Patient fully consented to today's internal vaginal examination. She demonstrates normal tone in superficial and deep pelvic floor musculature bilaterally with no palpable trigger points. Her pelvic floor contraction is strong but limited in range of motion due to stiffness throughout. No significant vaginal wall laxity present with cough test in hooklying. Pt had no pain with today's examination. Overall, pt tolerated session well and Pt would benefit from additional PT to further address deficits.    OBJECTIVE IMPAIRMENTS: decreased coordination, decreased endurance, decreased mobility, decreased ROM, and decreased strength.   ACTIVITY LIMITATIONS: continence and toileting  PARTICIPATION LIMITATIONS: occupation  PERSONAL FACTORS: Age, Past/current experiences, and Time since onset of injury/illness/exacerbation are also  affecting patient's functional outcome.   REHAB POTENTIAL: Good  CLINICAL DECISION MAKING: Stable/uncomplicated  EVALUATION COMPLEXITY: Low   GOALS: Goals reviewed with patient? Yes  SHORT TERM GOALS: Target date: 04/19/2024  Pt will be independent with HEP.  Baseline: Goal status: INITIAL  2.  Pt will be independent with diaphragmatic breathing and down training activities in order to improve pelvic floor relaxation. Baseline:  Goal status: INITIAL  3.  Pt will be independent with the knack, urge suppression technique, and double voiding in order to improve bladder habits and decrease urinary incontinence.  Baseline:  Goal status: INITIAL  4.  Pt will be independent with use of squatty potty, relaxed toileting mechanics, and improved bowel movement techniques in order to increase ease of bowel movements and complete evacuation.  Baseline:  Goal status: INITIAL  LONG TERM GOALS: Target date: 09/19/2024  Pt will be independent with advanced HEP.  Baseline:  Goal status: INITIAL  2.  Pt to demonstrate improved coordination of pelvic floor and breathing mechanics with 10# squat with appropriate synergistic patterns to decrease pain and leakage at least 75% of the time for improved ability to complete a 30 minute workout with strain at pelvic floor and symptoms.   Baseline:  Goal status: INITIAL  3.  Pt will report no leaks with laughing, coughing, sneezing in order to improve comfort with interpersonal relationships and community activities.  Baseline:  Goal status: INITIAL  PLAN:  PT FREQUENCY: 1-2x/week  PT DURATION: 6 months  PLANNED INTERVENTIONS: 97110-Therapeutic exercises, 97530- Therapeutic activity, 97112- Neuromuscular re-education, 97535- Self Care, [id number]- Manual therapy, Patient/Family education, Taping, Joint mobilization, Spinal mobilization, Scar mobilization, Cryotherapy, Moist heat, and Biofeedback  PLAN FOR NEXT SESSION: continued pelvic lfoor training  in seated; knack and urge drill; abdominal massage; core strengthening; manual to back    Celena JAYSON Domino, PT 03/22/2024, 10:48 AM  "

## 2024-05-02 ENCOUNTER — Ambulatory Visit: Admitting: Physical Therapy

## 2024-05-16 ENCOUNTER — Ambulatory Visit: Admitting: Physical Therapy

## 2024-05-30 ENCOUNTER — Ambulatory Visit: Admitting: Physical Therapy

## 2024-06-13 ENCOUNTER — Ambulatory Visit: Admitting: Physical Therapy

## 2024-06-27 ENCOUNTER — Ambulatory Visit: Admitting: Physical Therapy

## 2024-07-11 ENCOUNTER — Ambulatory Visit: Admitting: Physical Therapy
